# Patient Record
Sex: Female | Born: 1955 | Race: White | Hispanic: No | Marital: Married | Smoking: Never smoker
Health system: Southern US, Community
[De-identification: ages and names within clinical notes are randomized; demographics above are authoritative.]

## PROBLEM LIST (undated history)

## (undated) DIAGNOSIS — I10 Essential (primary) hypertension: Secondary | ICD-10-CM

## (undated) HISTORY — PX: OTHER SURGICAL HISTORY: SHX169

## (undated) HISTORY — DX: Essential (primary) hypertension: I10

---

## 1993-06-13 DIAGNOSIS — C4491 Basal cell carcinoma of skin, unspecified: Secondary | ICD-10-CM

## 1993-06-13 HISTORY — DX: Basal cell carcinoma of skin, unspecified: C44.91

## 2008-06-13 HISTORY — PX: COLONOSCOPY: SHX174

## 2014-06-13 HISTORY — PX: COLONOSCOPY: SHX174

## 2018-02-13 ENCOUNTER — Other Ambulatory Visit (HOSPITAL_COMMUNITY): Payer: Self-pay | Admitting: Internal Medicine

## 2018-02-13 DIAGNOSIS — Z13820 Encounter for screening for osteoporosis: Secondary | ICD-10-CM

## 2018-02-22 ENCOUNTER — Ambulatory Visit (HOSPITAL_COMMUNITY)
Admission: RE | Admit: 2018-02-22 | Discharge: 2018-02-22 | Disposition: A | Discharge status: Home / Self Care | Payer: Managed Care, Other (non HMO) | Source: Ambulatory Visit | Attending: Internal Medicine | Admitting: Internal Medicine

## 2018-02-22 DIAGNOSIS — Z13820 Encounter for screening for osteoporosis: Secondary | ICD-10-CM | POA: Diagnosis not present

## 2018-02-22 DIAGNOSIS — M8589 Other specified disorders of bone density and structure, multiple sites: Secondary | ICD-10-CM | POA: Diagnosis not present

## 2018-12-03 ENCOUNTER — Encounter (INDEPENDENT_AMBULATORY_CARE_PROVIDER_SITE_OTHER): Payer: Managed Care, Other (non HMO) | Admitting: Internal Medicine

## 2019-02-13 ENCOUNTER — Telehealth (INDEPENDENT_AMBULATORY_CARE_PROVIDER_SITE_OTHER): Payer: Self-pay

## 2019-02-13 NOTE — Telephone Encounter (Signed)
Pt called and stated [facility] called LVM she was due for her visit.  Pt stated you told her she did not need to have a PAP every year anymore.   Also want to see if she is due for a Mammogram also. If so she would like to do everything in [place] if she can.

## 2019-02-13 NOTE — Telephone Encounter (Signed)
Sent the order via fax to [facility] to schedule.

## 2019-02-13 NOTE — Telephone Encounter (Signed)
Pt would like to have [place] aph.

## 2019-02-25 ENCOUNTER — Other Ambulatory Visit (INDEPENDENT_AMBULATORY_CARE_PROVIDER_SITE_OTHER): Payer: Self-pay

## 2019-02-25 DIAGNOSIS — Z1231 Encounter for screening mammogram for malignant neoplasm of breast: Secondary | ICD-10-CM

## 2019-02-25 NOTE — Progress Notes (Signed)
Okay, please go ahead and order a screening mammogram for this patient.  Thanks.

## 2019-02-25 NOTE — Progress Notes (Signed)
Pt is coming due for screening mammogram. Would like it local at [facility].

## 2019-02-26 ENCOUNTER — Other Ambulatory Visit (INDEPENDENT_AMBULATORY_CARE_PROVIDER_SITE_OTHER): Payer: Self-pay

## 2019-02-26 DIAGNOSIS — Z1231 Encounter for screening mammogram for malignant neoplasm of breast: Secondary | ICD-10-CM

## 2019-02-26 NOTE — Progress Notes (Signed)
Pt would like Mammogram to be done here local;Crystal Mccarthy.

## 2019-03-13 ENCOUNTER — Ambulatory Visit (INDEPENDENT_AMBULATORY_CARE_PROVIDER_SITE_OTHER): Payer: Self-pay | Admitting: Internal Medicine

## 2019-03-25 ENCOUNTER — Encounter (INDEPENDENT_AMBULATORY_CARE_PROVIDER_SITE_OTHER): Payer: Self-pay | Admitting: Internal Medicine

## 2019-03-25 ENCOUNTER — Other Ambulatory Visit: Payer: Self-pay

## 2019-03-25 ENCOUNTER — Ambulatory Visit (INDEPENDENT_AMBULATORY_CARE_PROVIDER_SITE_OTHER): Payer: Medicare HMO | Admitting: Internal Medicine

## 2019-03-25 VITALS — BP 140/90 | HR 72 | Ht 63.0 in | Wt 164.6 lb

## 2019-03-25 DIAGNOSIS — R21 Rash and other nonspecific skin eruption: Secondary | ICD-10-CM | POA: Diagnosis not present

## 2019-03-25 MED ORDER — PREDNISONE 20 MG PO TABS: 40.0000 mg | ORAL_TABLET | Freq: Every day | ORAL | 1 refills | Status: DC

## 2019-03-25 NOTE — Progress Notes (Signed)
Wellness Office Visit  Subjective:  Patient ID: Crystal Mccarthy, female    DOB: 31-Jul-1955  Age: 63 y.o. MRN: [id number]  CC: This lady comes in for an acute visit with a chief complaint of skin rash. HPI  She noticed about 2 weeks ago that she had one lesion in the left trunk area just inferior to the left breast which was measuring about 0.5 cm in diameter and actually has improved in the last couple of weeks. However, she started to get more of a similar skin lesions on the left side of her neck in the last few days and also on her back. She cannot relate that she has changed medications, washing powder or soaps. She has played golf 4 times in the last couple of weeks.  She denies being in contact with any plants that might of caused some kind of contact dermatitis. The rash is itchy but not painful.  It does not seem to follow a dermatomal distribution. She systemically feels reasonably well except for fatigue. Past Medical History:  Diagnosis Date  . Essential hypertension, benign Yes      History reviewed. No pertinent family history.  Social History   Social History Narrative   Married for 29 years,lives with husband.     Current Meds  Medication Sig  . buPROPion (WELLBUTRIN XL) 300 MG 24 hr tablet Take 300 mg by mouth daily.  . Cholecalciferol (VITAMIN D-3) 125 MCG (5000 UT) TABS Take 1 tablet by mouth daily.  Marland Kitchen eletriptan (RELPAX) 40 MG tablet Take 40 mg by mouth as needed for migraine or headache. May repeat in 2 hours if headache persists or recurs.  Marland Kitchen estradiol (ESTRACE) 2 MG tablet Take 2 mg by mouth daily.  Marland Kitchen losartan-hydrochlorothiazide (HYZAAR) 100-25 MG tablet Take 1 tablet by mouth daily.  . Magnesium 500 MG TABS Take 1 tablet by mouth daily.  . Melatonin 3 MG CAPS Take 3 mg by mouth every evening.  . NP THYROID 120 MG tablet Take 120 mg by mouth daily before breakfast.  . pantoprazole (PROTONIX) 40 MG tablet Take 40 mg by mouth daily as needed.  .  progesterone (PROMETRIUM) 200 MG capsule Take 400 mg by mouth every evening.  . Testosterone 20 % CREA Apply 2.5 mg topically daily.  . valACYclovir (VALTREX) 1000 MG tablet Take 1,000 mg by mouth 2 (two) times daily as needed.       Objective:   Today's Vitals: BP 140/90   Pulse 72   Ht 5\' 3"  (1.6 m)   Wt 164 lb 9.6 oz (74.7 kg)   BMI 29.16 kg/m  Vitals with BMI 03/25/2019  Height 5\' 3"   Weight 164 lbs 10 oz  BMI [id number]  Systolic [id number]  Diastolic 90  Pulse 72     Physical Exam  She looks systemically well and she appears to have macular rash on the left side of her neck, lesion on her right back and also on the left trunk.  These lesions appear to be similar to a contact dermatitis type of rash.     Assessment   1. Skin rash       Tests ordered No orders of the defined types were placed in this encounter.    Plan: 1. I wonder if the rash does represent some kind of contact dermatitis and I will treat her empirically with prednisone.  I have explained possible side effects of steroids.  If she does not improve, she will let me know.  Meds ordered this encounter  Medications  . predniSONE (DELTASONE) 20 MG tablet    Sig: Take 2 tablets (40 mg total) by mouth daily with breakfast.    Dispense:  10 tablet    Refill:  1     Luther Parody, MD

## 2019-04-04 ENCOUNTER — Other Ambulatory Visit (INDEPENDENT_AMBULATORY_CARE_PROVIDER_SITE_OTHER): Payer: Self-pay | Admitting: Internal Medicine

## 2019-04-05 ENCOUNTER — Encounter (INDEPENDENT_AMBULATORY_CARE_PROVIDER_SITE_OTHER): Payer: Self-pay | Admitting: Internal Medicine

## 2019-04-05 ENCOUNTER — Other Ambulatory Visit (INDEPENDENT_AMBULATORY_CARE_PROVIDER_SITE_OTHER): Payer: Self-pay | Admitting: Internal Medicine

## 2019-04-05 MED ORDER — BUPROPION HCL ER (XL) 300 MG PO TB24: 300.0000 mg | ORAL_TABLET | Freq: Every day | ORAL | 0 refills | Status: DC

## 2019-04-24 ENCOUNTER — Other Ambulatory Visit (INDEPENDENT_AMBULATORY_CARE_PROVIDER_SITE_OTHER): Payer: Self-pay | Admitting: Internal Medicine

## 2019-04-24 ENCOUNTER — Other Ambulatory Visit (INDEPENDENT_AMBULATORY_CARE_PROVIDER_SITE_OTHER): Payer: Self-pay

## 2019-04-24 MED ORDER — BUPROPION HCL ER (XL) 300 MG PO TB24: 300.0000 mg | ORAL_TABLET | Freq: Every day | ORAL | 2 refills | Status: DC

## 2019-04-24 MED ORDER — BUPROPION HCL ER (XL) 300 MG PO TB24: 300.0000 mg | ORAL_TABLET | Freq: Every day | ORAL | 0 refills | Status: DC

## 2019-04-24 MED ORDER — ELETRIPTAN HYDROBROMIDE 40 MG PO TABS: 40.0000 mg | ORAL_TABLET | ORAL | 0 refills | Status: DC | PRN

## 2019-04-24 MED ORDER — ELETRIPTAN HYDROBROMIDE 40 MG PO TABS: 40.0000 mg | ORAL_TABLET | ORAL | 3 refills | Status: DC | PRN

## 2019-04-25 ENCOUNTER — Other Ambulatory Visit (INDEPENDENT_AMBULATORY_CARE_PROVIDER_SITE_OTHER): Payer: Self-pay | Admitting: Internal Medicine

## 2019-04-25 MED ORDER — NP THYROID 120 MG PO TABS: 120.0000 mg | ORAL_TABLET | Freq: Every day | ORAL | 0 refills | Status: DC

## 2019-04-25 MED ORDER — ESTRADIOL 2 MG PO TABS: 2.0000 mg | ORAL_TABLET | Freq: Every day | ORAL | 0 refills | Status: DC

## 2019-04-25 MED ORDER — BUPROPION HCL ER (XL) 300 MG PO TB24: 300.0000 mg | ORAL_TABLET | Freq: Every day | ORAL | 0 refills | Status: DC

## 2019-04-25 MED ORDER — PROGESTERONE MICRONIZED 200 MG PO CAPS: 400.0000 mg | ORAL_CAPSULE | Freq: Every evening | ORAL | 0 refills | Status: DC

## 2019-04-27 ENCOUNTER — Encounter (INDEPENDENT_AMBULATORY_CARE_PROVIDER_SITE_OTHER): Payer: Self-pay | Admitting: Internal Medicine

## 2019-05-01 ENCOUNTER — Ambulatory Visit (INDEPENDENT_AMBULATORY_CARE_PROVIDER_SITE_OTHER): Payer: Medicare HMO | Admitting: Internal Medicine

## 2019-05-01 ENCOUNTER — Encounter (INDEPENDENT_AMBULATORY_CARE_PROVIDER_SITE_OTHER): Payer: Self-pay | Admitting: Internal Medicine

## 2019-05-01 ENCOUNTER — Other Ambulatory Visit: Payer: Self-pay

## 2019-05-01 VITALS — BP 116/70 | HR 72 | Temp 98.0°F | Resp 18 | Ht 65.0 in | Wt 163.0 lb

## 2019-05-01 DIAGNOSIS — I10 Essential (primary) hypertension: Secondary | ICD-10-CM | POA: Insufficient documentation

## 2019-05-01 DIAGNOSIS — E782 Mixed hyperlipidemia: Secondary | ICD-10-CM

## 2019-05-01 DIAGNOSIS — Z23 Encounter for immunization: Secondary | ICD-10-CM | POA: Diagnosis not present

## 2019-05-01 DIAGNOSIS — E2839 Other primary ovarian failure: Secondary | ICD-10-CM | POA: Insufficient documentation

## 2019-05-01 DIAGNOSIS — E559 Vitamin D deficiency, unspecified: Secondary | ICD-10-CM | POA: Insufficient documentation

## 2019-05-01 DIAGNOSIS — F32A Depression, unspecified: Secondary | ICD-10-CM

## 2019-05-01 DIAGNOSIS — Z1159 Encounter for screening for other viral diseases: Secondary | ICD-10-CM | POA: Diagnosis not present

## 2019-05-01 DIAGNOSIS — R69 Illness, unspecified: Secondary | ICD-10-CM | POA: Diagnosis not present

## 2019-05-01 DIAGNOSIS — F329 Major depressive disorder, single episode, unspecified: Secondary | ICD-10-CM

## 2019-05-01 DIAGNOSIS — E039 Hypothyroidism, unspecified: Secondary | ICD-10-CM

## 2019-05-01 HISTORY — DX: Hypothyroidism, unspecified: E03.9

## 2019-05-01 HISTORY — DX: Vitamin D deficiency, unspecified: E55.9

## 2019-05-01 HISTORY — DX: Other primary ovarian failure: E28.39

## 2019-05-01 HISTORY — DX: Depression, unspecified: F32.A

## 2019-05-01 NOTE — Progress Notes (Signed)
Metrics: Intervention Frequency ACO  Documented Smoking Status Yearly  Screened one or more times in 24 months  Cessation Counseling or  Active cessation medication Past 24 months  Past 24 months   Guideline developer: UpToDate (See UpToDate for funding source) Date Released: 2014       Wellness Office Visit  Subjective:  Patient ID: Crystal Mccarthy, female    DOB: [birth year]  Age: 63 y.o. MRN: [id number]  CC: This lady comes in for follow-up of depression, hypertension, hypothyroidism, menopausal symptoms and vitamin D deficiency. HPI  She is doing reasonably well and maintaining all her medications consistently. She continues with Wellbutrin for depression which seems to be helping her as she told me that she had not taken it for a week by accident and felt very down and depressed. She continues on bioidentical hormone therapy for menopausal symptoms and she is tolerating these well. She continues on antihypertensive therapy.  She denies any chest pain, dyspnea, palpitations or limb weakness. She also continues on desiccated thyroid for hypothyroidism. Past Medical History:  Diagnosis Date  . Depression 05/01/2019  . Essential hypertension, benign Yes  . Hypothyroidism, adult 05/01/2019  . 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 Vitamin D deficiency disease 05/01/2019      History reviewed. No pertinent family history.  Social History   Social History Narrative   Married for 90 years,lives with husband.   Social History   Tobacco Use  . Smoking status: Never Smoker  . Smokeless tobacco: Never Used  Substance Use Topics  . Alcohol use: Not on file    Comment: occ    Current Meds  Medication Sig  . buPROPion (WELLBUTRIN XL) 300 MG 24 hr tablet Take 1 tablet (300 mg total) by mouth daily.  . Cholecalciferol (VITAMIN D-3) 125 MCG (5000 UT) TABS Take 1 tablet by mouth daily.  Marland Kitchen eletriptan (RELPAX) 40 MG tablet Take 1 tablet (40 mg total) by mouth as needed for migraine or  headache. May repeat in 2 hours if headache persists or recurs.  Marland Kitchen estradiol (ESTRACE) 2 MG tablet Take 1 tablet (2 mg total) by mouth daily.  Marland Kitchen losartan-hydrochlorothiazide (HYZAAR) 100-25 MG tablet Take 1 tablet by mouth daily.  . Magnesium 500 MG TABS Take 1 tablet by mouth daily.  . Melatonin 3 MG CAPS Take 3 mg by mouth every evening.  . NP THYROID 120 MG tablet Take 1 tablet (120 mg total) by mouth daily before breakfast.  . pantoprazole (PROTONIX) 40 MG tablet Take 40 mg by mouth daily as needed.  . progesterone (PROMETRIUM) 200 MG capsule Take 2 capsules (400 mg total) by mouth every evening.  . Testosterone 20 % CREA Apply 2.5 mg topically daily.  . valACYclovir (VALTREX) 1000 MG tablet Take 1,000 mg by mouth 2 (two) times daily as needed.     Nutrition  She tries to be as consistent as she can.  She has not lost further weight. Sleep  Adequate.  Exercise  She does walk with her dog on a regular basis. Bio Identical Hormones  Testosterone therapy is being used off label for symptoms of testosterone deficiency and benefits that it produces based on several studies.  These benefits include decreasing body fat, increasing in lean muscle mass and increasing in bone density.  There is improvement of memory, cognition.  There is improvement in exercise tolerance and endurance.  Testosterone therapy has also been shown to be protective against coronary artery disease, cerebrovascular disease, diabetes, hypertension and degenerative joint disease.  I have discussed with the patient the FDA warnings regarding testosterone therapy, benefits and side effects and modes of administration as well as monitoring blood levels and side effects  on a regular basis The patient is agreeable that testosterone therapy should be an integral part of his/her wellness,quality of life and prevention of chronic disease.  Micronized progesterone is being used in this patient for multiple benefits based on  studies including protection against uterine cancer, breast cancer, osteoporosis and heart disease. The patient has been counseled regarding side effects, benefits and modes of administration. The patient is agreeable that this therapy is an integral part of her wellness, quality of life and prevention of chronic disease.  Estradiol is being used in this patient for multiple benefits based on several studies including protection against heart disease, cerebrovascular disease, osteoporosis, colon cancer, Alzheimer's disease, macular degeneration and cataracts. The patient has been counseled regarding benefits and side effects and modes of administration. The patient is agreeable that this therapy is an integral to part of her wellness, quality of life and prevention of chronic disease.  Objective:   Today's Vitals: BP 116/70 (BP Location: Left Arm, Patient Position: Sitting, Cuff Size: Normal)   Pulse 72   Temp 98 F (36.7 C) (Temporal)   Resp 18   Ht '5\' 5"'$  (1.651 m)   Wt 163 lb (73.9 kg)   SpO2 96%   BMI 27.12 kg/m  Vitals with BMI 05/01/2019 03/25/2019  Height '5\' 5"'$  '5\' 3"'$   Weight 163 lbs 164 lbs 10 oz  BMI 35.46 56.81  Systolic 275 170  Diastolic 70 90  Pulse 72 72     Physical Exam   She looks systemically well.  Her weight is very stable but she has not lost further weight.  No new physical findings today.    Assessment   1. Mixed hyperlipidemia   2. Need for immunization against influenza   3. Hypothyroidism, adult   4. Vitamin D deficiency disease   5. Essential hypertension, benign   6. Primary ovarian failure   7. Depression, unspecified depression type   8. Encounter for hepatitis C screening test for low risk patient       Tests ordered Orders Placed This Encounter  Procedures  . Flu Vaccine QUAD High Dose(Fluad)  . CMP with eGFR([facility])  . Lipid Panel  . Vitamin D, 25-hydroxy  . T3, Free  . T4  . Estradiol  . Progesterone  . Testos,Total,Free and  SHBG (Female)  . Hep C Antibody     Plan: 1. She will continue with desiccated NP thyroid and we will see what the thyroid levels are. 2. She will continue with vitamin D3 supplementation and I will check levels again today. 3. She will continue with all bioidentical hormone therapies and I will check all the levels today also. 4. She will continue with Wellbutrin which seems to keep her very stable. 5. High-dose influenza vaccination was given today. 6. Further recommendations will depend on blood results and I will see her in about 4 months time for follow-up.   No orders of the defined types were placed in this encounter.   Doree Albee, MD

## 2019-05-05 LAB — COMPLETE METABOLIC PANEL WITH GFR
AG Ratio: 1.8 (calc) (ref 1.0–2.5)
ALT: 10 U/L (ref 6–29)
AST: 18 U/L (ref 10–35)
Albumin: 4.3 g/dL (ref 3.6–5.1)
Alkaline phosphatase (APISO): 62 U/L (ref 37–153)
BUN: 13 mg/dL (ref 7–25)
CO2: 28 mmol/L (ref 20–32)
Calcium: 9.4 mg/dL (ref 8.6–10.4)
Chloride: 102 mmol/L (ref 98–110)
Creat: 0.91 mg/dL (ref 0.50–0.99)
GFR, Est African American: 77 mL/min/{1.73_m2} (ref >=60)
GFR, Est Non African American: 66 mL/min/{1.73_m2} (ref >=60)
Globulin: 2.4 g/dL (calc) (ref 1.9–3.7)
Glucose, Bld: 86 mg/dL (ref 65–99)
Potassium: 3.7 mmol/L (ref 3.5–5.3)
Sodium: 139 mmol/L (ref 135–146)
Total Bilirubin: 0.4 mg/dL (ref 0.2–1.2)
Total Protein: 6.7 g/dL (ref 6.1–8.1)

## 2019-05-05 LAB — TESTOS,TOTAL,FREE AND SHBG (FEMALE)
Free Testosterone: 11.3 pg/mL — ABNORMAL HIGH (ref 0.1–6.4)
Sex Hormone Binding: 208 nmol/L — ABNORMAL HIGH (ref 14–73)
Testosterone, Total, LC-MS-MS: 225 ng/dL — ABNORMAL HIGH (ref 2–45)

## 2019-05-05 LAB — LIPID PANEL
Cholesterol: 253 mg/dL — ABNORMAL HIGH (ref <200)
HDL: 68 mg/dL (ref >=50)
LDL Cholesterol (Calc): 168 mg/dL (calc) — ABNORMAL HIGH
Non-HDL Cholesterol (Calc): 185 mg/dL (calc) — ABNORMAL HIGH (ref <130)
Total CHOL/HDL Ratio: 3.7 (calc) (ref <5.0)
Triglycerides: 70 mg/dL (ref <150)

## 2019-05-05 LAB — T3, FREE: T3, Free: 3.5 pg/mL (ref 2.3–4.2)

## 2019-05-05 LAB — PROGESTERONE: Progesterone: 32.6 ng/mL

## 2019-05-05 LAB — T4: T4, Total: 6.7 ug/dL (ref 5.1–11.9)

## 2019-05-05 LAB — ESTRADIOL: Estradiol: 78 pg/mL

## 2019-05-05 LAB — HEPATITIS C ANTIBODY
Hepatitis C Ab: NONREACTIVE
SIGNAL TO CUT-OFF: 0.02 (ref <1.00)

## 2019-05-05 LAB — VITAMIN D 25 HYDROXY (VIT D DEFICIENCY, FRACTURES): Vit D, 25-Hydroxy: 68 ng/mL (ref 30–100)

## 2019-06-14 HISTORY — PX: BREAST BIOPSY: SHX20

## 2019-06-20 DIAGNOSIS — G43909 Migraine, unspecified, not intractable, without status migrainosus: Secondary | ICD-10-CM | POA: Insufficient documentation

## 2019-06-20 DIAGNOSIS — Z01419 Encounter for gynecological examination (general) (routine) without abnormal findings: Secondary | ICD-10-CM | POA: Diagnosis not present

## 2019-06-20 DIAGNOSIS — Z1231 Encounter for screening mammogram for malignant neoplasm of breast: Secondary | ICD-10-CM | POA: Diagnosis not present

## 2019-06-20 DIAGNOSIS — Z6830 Body mass index (BMI) 30.0-30.9, adult: Secondary | ICD-10-CM | POA: Diagnosis not present

## 2019-06-25 ENCOUNTER — Other Ambulatory Visit: Payer: Self-pay

## 2019-06-25 ENCOUNTER — Other Ambulatory Visit: Payer: Self-pay | Admitting: Obstetrics and Gynecology

## 2019-06-25 ENCOUNTER — Ambulatory Visit: Payer: Medicare HMO

## 2019-06-25 ENCOUNTER — Ambulatory Visit
Admission: RE | Admit: 2019-06-25 | Discharge: 2019-06-25 | Disposition: A | Discharge status: Home / Self Care | Payer: Medicare HMO | Source: Ambulatory Visit | Attending: Obstetrics and Gynecology | Admitting: Obstetrics and Gynecology

## 2019-06-25 DIAGNOSIS — N6311 Unspecified lump in the right breast, upper outer quadrant: Secondary | ICD-10-CM | POA: Diagnosis not present

## 2019-06-25 DIAGNOSIS — R928 Other abnormal and inconclusive findings on diagnostic imaging of breast: Secondary | ICD-10-CM

## 2019-06-25 DIAGNOSIS — N6310 Unspecified lump in the right breast, unspecified quadrant: Secondary | ICD-10-CM

## 2019-06-26 ENCOUNTER — Encounter (INDEPENDENT_AMBULATORY_CARE_PROVIDER_SITE_OTHER): Payer: Self-pay | Admitting: Internal Medicine

## 2019-06-28 ENCOUNTER — Other Ambulatory Visit: Payer: Self-pay

## 2019-06-28 ENCOUNTER — Ambulatory Visit
Admission: RE | Admit: 2019-06-28 | Discharge: 2019-06-28 | Disposition: A | Discharge status: Home / Self Care | Payer: Medicare HMO | Source: Ambulatory Visit | Attending: Obstetrics and Gynecology | Admitting: Obstetrics and Gynecology

## 2019-06-28 DIAGNOSIS — N6310 Unspecified lump in the right breast, unspecified quadrant: Secondary | ICD-10-CM

## 2019-06-28 DIAGNOSIS — N6311 Unspecified lump in the right breast, upper outer quadrant: Secondary | ICD-10-CM | POA: Diagnosis not present

## 2019-07-08 ENCOUNTER — Encounter (INDEPENDENT_AMBULATORY_CARE_PROVIDER_SITE_OTHER): Payer: Self-pay | Admitting: Internal Medicine

## 2019-07-17 DIAGNOSIS — R69 Illness, unspecified: Secondary | ICD-10-CM | POA: Diagnosis not present

## 2019-07-18 ENCOUNTER — Other Ambulatory Visit (INDEPENDENT_AMBULATORY_CARE_PROVIDER_SITE_OTHER): Payer: Self-pay | Admitting: Internal Medicine

## 2019-07-22 ENCOUNTER — Other Ambulatory Visit (INDEPENDENT_AMBULATORY_CARE_PROVIDER_SITE_OTHER): Payer: Self-pay | Admitting: Internal Medicine

## 2019-07-29 ENCOUNTER — Telehealth (INDEPENDENT_AMBULATORY_CARE_PROVIDER_SITE_OTHER): Payer: Self-pay

## 2019-07-29 NOTE — Telephone Encounter (Signed)
She should take 2 capsules 200mg  every night(total dose 400mg  at night)

## 2019-07-29 NOTE — Telephone Encounter (Signed)
Pt concern on progesterone 200mg . Is this order correct? She was only taking 200 mg a day. Now she has a Rx for 200 mg but to ake 2 capsules a day.

## 2019-07-31 NOTE — Telephone Encounter (Signed)
I think she should be on Progesterone 400mg (2 capsules) at night. If this dose makes her too drowsy then she can go to 1 capsule at night and I will discuss this on the next visit.

## 2019-07-31 NOTE — Telephone Encounter (Signed)
She said she sleeps we are well. So she was not sure of the pill was different and all. She is okay with change.

## 2019-07-31 NOTE — Telephone Encounter (Signed)
Pt was called and given instructions on medication was correct.

## 2019-08-29 ENCOUNTER — Ambulatory Visit (INDEPENDENT_AMBULATORY_CARE_PROVIDER_SITE_OTHER): Payer: Medicare HMO | Admitting: Internal Medicine

## 2019-09-11 ENCOUNTER — Encounter (INDEPENDENT_AMBULATORY_CARE_PROVIDER_SITE_OTHER): Payer: Self-pay | Admitting: Internal Medicine

## 2019-09-11 ENCOUNTER — Ambulatory Visit (INDEPENDENT_AMBULATORY_CARE_PROVIDER_SITE_OTHER): Payer: Medicare HMO | Admitting: Internal Medicine

## 2019-09-11 ENCOUNTER — Other Ambulatory Visit: Payer: Self-pay

## 2019-09-11 VITALS — BP 125/85 | HR 78 | Temp 99.4°F | Ht 63.0 in | Wt 168.2 lb

## 2019-09-11 DIAGNOSIS — Z1211 Encounter for screening for malignant neoplasm of colon: Secondary | ICD-10-CM | POA: Diagnosis not present

## 2019-09-11 DIAGNOSIS — E559 Vitamin D deficiency, unspecified: Secondary | ICD-10-CM

## 2019-09-11 DIAGNOSIS — E039 Hypothyroidism, unspecified: Secondary | ICD-10-CM | POA: Diagnosis not present

## 2019-09-11 DIAGNOSIS — F32A Depression, unspecified: Secondary | ICD-10-CM

## 2019-09-11 DIAGNOSIS — R69 Illness, unspecified: Secondary | ICD-10-CM | POA: Diagnosis not present

## 2019-09-11 DIAGNOSIS — F329 Major depressive disorder, single episode, unspecified: Secondary | ICD-10-CM

## 2019-09-11 DIAGNOSIS — E2839 Other primary ovarian failure: Secondary | ICD-10-CM

## 2019-09-11 MED ORDER — THYROID 30 MG PO TABS: 30.0000 mg | ORAL_TABLET | Freq: Every day | ORAL | 3 refills | Status: DC

## 2019-09-11 NOTE — Progress Notes (Signed)
Metrics: Intervention Frequency ACO  Documented Smoking Status Yearly  Screened one or more times in 24 months  Cessation Counseling or  Active cessation medication Past 24 months  Past 24 months   Guideline developer: UpToDate (See UpToDate for funding source) Date Released: 2014       Wellness Office Visit  Subjective:  Patient ID: Crystal Mccarthy, female    DOB: [birth year]  Age: 64 y.o. MRN: [id number]  CC: This lady comes in for follow-up of hypertension, hypothyroidism, menopausal symptoms and bioidentical hormone therapy. HPI  She is doing reasonably well but she has gained weight and she is frustrated.  She realizes that she has not kept to a diet that seems to work for her.  She is determined to go back on weight watchers which seems to be successful for previously. She is consistent with bioidentical hormone therapy and her levels last time were very good. She does describe cystic lesions on her vulva and I have suggested that she decrease testosterone cream 4 days a week now.  I reviewed her blood work from the last visit and her T3 is not optimal. Past Medical History:  Diagnosis Date  . Depression 05/01/2019  . Essential hypertension, benign Yes  . Hypothyroidism, adult 05/01/2019  . 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 Vitamin D deficiency disease 05/01/2019      History reviewed. No pertinent family history.  Social History   Social History Narrative   Married for 59 years,lives with husband.   Social History   Tobacco Use  . Smoking status: Never Smoker  . Smokeless tobacco: Never Used  Substance Use Topics  . Alcohol use: Not on file    Comment: occ    Current Meds  Medication Sig  . Ascorbic Acid (VITAMIN C) 1000 MG tablet Take 1,000 mg by mouth daily.  Marland Kitchen aspirin EC 81 MG tablet Take 81 mg by mouth daily.  Marland Kitchen buPROPion (WELLBUTRIN XL) 300 MG 24 hr tablet Take 1 tablet (300 mg total) by mouth daily.  . Cholecalciferol (VITAMIN D-3) 125 MCG (5000 UT)  TABS Take 1 tablet by mouth daily.  . Coenzyme Q10 100 MG TABS Take 300 mg by mouth daily.  Marland Kitchen eletriptan (RELPAX) 40 MG tablet Take 1 tablet (40 mg total) by mouth as needed for migraine or headache. May repeat in 2 hours if headache persists or recurs.  Marland Kitchen estradiol (ESTRACE) 2 MG tablet Take 1 tablet (2 mg total) by mouth daily.  Javier Docker Oil 500 MG CAPS Take 500 mg by mouth daily.  Marland Kitchen LORAZEPAM PO Take 0.5 mg by mouth.  . losartan-hydrochlorothiazide (HYZAAR) 100-25 MG tablet Take 1 tablet by mouth once daily  . Magnesium 500 MG TABS Take 1 tablet by mouth daily.  . Melatonin 3 MG CAPS Take 3 mg by mouth every evening.  . NP THYROID 120 MG tablet TAKE 1 TABLET(120 MG) BY MOUTH DAILY BEFORE BREAKFAST  . pantoprazole (PROTONIX) 40 MG tablet Take 40 mg by mouth daily as needed.  . progesterone (PROMETRIUM) 200 MG capsule TAKE TWO CAPSULES BY MOUTH DAILY IN THE EVENING  . Testosterone 20 % CREA Apply 2.5 mg topically daily.  . valACYclovir (VALTREX) 1000 MG tablet Take 1,000 mg by mouth 2 (two) times daily as needed.      Objective:   Today's Vitals: BP 125/85 (BP Location: Left Arm, Patient Position: Sitting, Cuff Size: Normal)   Pulse 78   Temp 99.4 F (37.4 C) (Temporal)   Ht 5\' 3"  (1.6  m)   Wt 168 lb 3.2 oz (76.3 kg)   SpO2 98%   BMI 29.80 kg/m  Vitals with BMI 09/11/2019 05/01/2019 03/25/2019  Height 5\' 3"  5\' 5"  5\' 3"   Weight 168 lbs 3 oz 163 lbs 164 lbs 10 oz  BMI 29.8 [id number]  Systolic 0000000 [id number]  Diastolic 85 70 90  Pulse 78 72 72     Physical Exam  She looks systemically well but she has gained 5 pounds since the last visit in November.  Blood pressure is under good control.     Assessment   1. Colon cancer screening   2. Hypothyroidism, adult   3. Primary ovarian failure   4. Vitamin D deficiency disease   5. Depression, unspecified depression type       Tests ordered Orders Placed This Encounter  Procedures  . Ambulatory referral to  Gastroenterology     Plan: 1. I recommended we increase the NP thyroid and add 30 mg at lunchtime or in the morning with the 120 mg.  Hopefully she will tolerate it and have sent a new prescription.  We need to optimize T3 levels. 2. I will refer to gastroenterology as she has not had a colonoscopy for more than 5 years and she is due now. 3. We discussed nutrition again and she will start weight watchers.  I discussed intermittent fasting and also a plant-based diet as well. 4. I will see her in about 4 months for an annual physical exam.  Today I spent 30 minutes with the patient discussing her thyroid and nutrition above.   Meds ordered this encounter  Medications  . thyroid (NP THYROID) 30 MG tablet    Sig: Take 1 tablet (30 mg total) by mouth daily before breakfast.    Dispense:  30 tablet    Refill:  3    Kolina Kube Luther Parody, MD

## 2019-09-16 ENCOUNTER — Encounter: Payer: Self-pay | Admitting: Internal Medicine

## 2019-09-17 ENCOUNTER — Other Ambulatory Visit (INDEPENDENT_AMBULATORY_CARE_PROVIDER_SITE_OTHER): Payer: Self-pay | Admitting: Internal Medicine

## 2019-09-25 ENCOUNTER — Other Ambulatory Visit: Payer: Medicare HMO

## 2019-10-07 ENCOUNTER — Telehealth (INDEPENDENT_AMBULATORY_CARE_PROVIDER_SITE_OTHER): Payer: Self-pay | Admitting: Internal Medicine

## 2019-10-07 ENCOUNTER — Other Ambulatory Visit (INDEPENDENT_AMBULATORY_CARE_PROVIDER_SITE_OTHER): Payer: Self-pay | Admitting: Internal Medicine

## 2019-10-07 MED ORDER — NP THYROID 120 MG PO TABS: ORAL_TABLET | ORAL | 1 refills | Status: DC

## 2019-10-08 ENCOUNTER — Other Ambulatory Visit (INDEPENDENT_AMBULATORY_CARE_PROVIDER_SITE_OTHER): Payer: Self-pay | Admitting: Nurse Practitioner

## 2019-10-08 NOTE — Telephone Encounter (Signed)
Done

## 2019-10-17 ENCOUNTER — Encounter (INDEPENDENT_AMBULATORY_CARE_PROVIDER_SITE_OTHER): Payer: Self-pay | Admitting: Internal Medicine

## 2019-10-29 NOTE — Progress Notes (Signed)
Referring Provider: Doree Albee, MD Primary Care Physician:  Doree Albee, MD Primary Gastroenterologist:  Dr. Gala Romney  Chief Complaint  Patient presents with  . Colonoscopy    due for 5 yr tcs; had tcs age 64 and 33; last tcs had polyp    HPI:   Crystal Mccarthy is a 64 y.o. female presenting today at the request of Doree Albee, MD for colon cancer screening. OV due to medications.   Today:  Reports TCS at age 52 that was a normal exam and again 5 years ago in [place]. Can't remember exactly where colonoscopy was performed or who performed the procedure.  Had 1 polyp and was advised to repeat in 5 years. No GI concerns. No abdominal pain. BMs every other day. No constipation or diarrhea. Has started eating more fiber which has made a huge difference. No blood in the stool. No black stool. No unintentional weight loss. No N/V. Takes Protonix as needed for GERD. This is rare and typically after eating something spicy. No dysphagia.   No family history of colon cancer.   Feels much better since increasing thyroid medication.  Has been struggling with a lot of fatigue.  Reports having a poor prep in the past. Repots for her last colonoscopy, she had an extra day of clears and did fine.   Past Medical History:  Diagnosis Date  . Depression 05/01/2019  . Essential hypertension, benign Yes  . Hypothyroidism, adult 05/01/2019  . 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 Vitamin D deficiency disease 05/01/2019    Past Surgical History:  Procedure Laterality Date  . cataract surgery    . COLONOSCOPY  2016   [place]; per patient, 1 polyp with recommendations to repeat in 5 years  . COLONOSCOPY  2010   [place], per patient, normal exam    Current Outpatient Medications  Medication Sig Dispense Refill  . Ascorbic Acid (VITAMIN C) 1000 MG tablet Take 1,000 mg by mouth daily.    Marland Kitchen aspirin EC 81 MG tablet Take 81 mg by mouth daily.    Marland Kitchen buPROPion (WELLBUTRIN XL)  300 MG 24 hr tablet Take 1 tablet (300 mg total) by mouth daily. 90 tablet 0  . Cholecalciferol (VITAMIN D-3) 125 MCG (5000 UT) TABS Take 1 tablet by mouth daily.    . Coenzyme Q10 100 MG TABS Take 300 mg by mouth daily.    Marland Kitchen eletriptan (RELPAX) 40 MG tablet Take 1 tablet (40 mg total) by mouth as needed for migraine or headache. May repeat in 2 hours if headache persists or recurs. 18 tablet 3  . estradiol (ESTRACE) 2 MG tablet TAKE ONE TABLET BY MOUTH ONE TIME DAILY  90 tablet 0  . Krill Oil 500 MG CAPS Take 500 mg by mouth daily.    Marland Kitchen LORAZEPAM PO Take 0.5 mg by mouth as needed.     Marland Kitchen losartan-hydrochlorothiazide (HYZAAR) 100-25 MG tablet Take 1 tablet by mouth once daily 90 tablet 0  . Magnesium 500 MG TABS Take 1 tablet by mouth daily.    . Melatonin 3 MG CAPS Take 3 mg by mouth every evening.    . NP THYROID 120 MG tablet TAKE 1 TABLET(120 MG) BY MOUTH DAILY BEFORE BREAKFAST 90 tablet 1  . pantoprazole (PROTONIX) 40 MG tablet Take 40 mg by mouth daily as needed.    . progesterone (PROMETRIUM) 200 MG capsule TAKE TWO CAPSULES BY MOUTH DAILY IN THE EVENING 180 capsule 0  . Testosterone 20 %  CREA Apply 2.5 mg topically daily.    Marland Kitchen thyroid (NP THYROID) 30 MG tablet Take 1 tablet (30 mg total) by mouth daily before breakfast. 30 tablet 3  . valACYclovir (VALTREX) 1000 MG tablet Take 1,000 mg by mouth 2 (two) times daily as needed.     No current facility-administered medications for this visit.    Allergies as of 10/30/2019 - Review Complete 10/30/2019  Allergen Reaction Noted  . Montelukast sodium Hives 09/11/2019    Family History  Problem Relation Age of Onset  . Colon cancer Neg Hx     Social History   Socioeconomic History  . Marital status: Married    Spouse name: Not on file  . Number of children: Not on file  . Years of education: Not on file  . Highest education level: Not on file  Occupational History  . Not on file  Tobacco Use  . Smoking status: Never Smoker  .  Smokeless tobacco: Never Used  Substance and Sexual Activity  . Alcohol use: Yes    Comment: occ- special occasions  . Drug use: Never  . Sexual activity: Not on file  Other Topics Concern  . Not on file  Social History Narrative   Married for 55 years,lives with husband.   Social Determinants of Health   Financial Resource Strain:   . Difficulty of Paying Living Expenses:   Food Insecurity:   . Worried About Charity fundraiser in the Last Year:   . Arboriculturist in the Last Year:   Transportation Needs:   . Film/video editor (Medical):   Marland Kitchen Lack of Transportation (Non-Medical):   Physical Activity:   . Days of Exercise per Week:   . Minutes of Exercise per Session:   Stress:   . Feeling of Stress :   Social Connections:   . Frequency of Communication with Friends and Family:   . Frequency of Social Gatherings with Friends and Family:   . Attends Religious Services:   . Active Member of Clubs or Organizations:   . Attends Archivist Meetings:   Marland Kitchen Marital Status:   Intimate Partner Violence:   . Fear of Current or Ex-Partner:   . Emotionally Abused:   Marland Kitchen Physically Abused:   . Sexually Abused:     Review of Systems: Gen: Denies any fever, chills, lightheadedness, dizziness, presyncope, syncope. CV: Denies chest pain. Rare heart palpitations.  Resp: Denies shortness of breath or cough.  GI: See HPI GU : Denies urinary burning, urinary frequency, urinary hesitancy MS: Denies regular joint pain.  Derm: Denies rash Psych: Denies depression or anxiety. Medications working well.  Heme: Denies bruising or bleeding  Physical Exam: BP (!) 147/94   Pulse 70   Temp 98 F (36.7 C) (Temporal)   Ht 5\' 3"  (1.6 m)   Wt 157 lb 9.6 oz (71.5 kg)   BMI 27.92 kg/m  General:   Alert and oriented. Pleasant and cooperative. Well-nourished and well-developed.  Head:  Normocephalic and atraumatic. Eyes:  Without icterus, sclera clear and conjunctiva pink.  Ears:   Normal auditory acuity. Lungs:  Clear to auscultation bilaterally. No wheezes, rales, or rhonchi. No distress.  Heart:  S1, S2 present without murmurs appreciated.  Abdomen:  +BS, soft, non-tender and non-distended. No HSM noted. No guarding or rebound. No masses appreciated.  Rectal:  Deferred  Msk:  Symmetrical without gross deformities. Normal posture. Extremities:  Without edema. Neurologic:  Alert and  oriented x4;  grossly normal neurologically. Skin:  Intact without significant lesions or rashes. Psych:  Normal mood and affect.

## 2019-10-30 ENCOUNTER — Ambulatory Visit: Payer: Medicare HMO | Admitting: Gastroenterology

## 2019-10-30 ENCOUNTER — Encounter (INDEPENDENT_AMBULATORY_CARE_PROVIDER_SITE_OTHER): Payer: Self-pay | Admitting: Internal Medicine

## 2019-10-30 ENCOUNTER — Encounter: Payer: Self-pay | Admitting: Gastroenterology

## 2019-10-30 ENCOUNTER — Other Ambulatory Visit: Payer: Self-pay

## 2019-10-30 DIAGNOSIS — Z86010 Personal history of colonic polyps: Secondary | ICD-10-CM | POA: Diagnosis not present

## 2019-10-30 NOTE — Assessment & Plan Note (Addendum)
64 year old female with history of HTN, depression, hypothyroidism, and colon polyps presenting to schedule her surveillance colonoscopy.  Reports prior colonoscopies in [place]. First at age 10 with normal exam.  Last colonoscopy 5 years ago with 1 polyp and recommended to repeat in 5 years.  No lower GI symptoms at this time.  No alarm symptoms.  No family history of colon cancer.  Patient cannot remember exactly where her colonoscopy was performed or who performed the procedure, so I cannot request records.  At this point, we we will go off her report of colon polyps with recommendations to repeat in 5 years and go ahead and proceed with colonoscopy.  Of note, she does report having a poor prep for her first colonoscopy but did well with 2 days of clears for her second colonoscopy.  Proceed with TCS with propofol with Dr. Gala Romney in the near future. The risks, benefits, and alternatives have been discussed in detail with patient. They have stated understanding and desire to proceed.  Propofol due to Wellbutrin and lorazepam Modified bowel prep: 2 days of clear liquids, 1 complete bowel prep the day before procedure with option of additional one half prep the morning before procedure if stools are not clear. Follow-up as recommended at time of colonoscopy.

## 2019-10-30 NOTE — Patient Instructions (Signed)
We will get you scheduled for colonoscopy in the near future with Dr. Gala Romney.  As you have had trouble with the prep in the past, we will modify her bowel prep. You will have 2 days of clear liquids and we will have you complete 1 full bowel prep with the possibility of an extra 1/2  bowel prep the morning prior to your procedure if your stools are not clear (see separate instructions).  We will plan to follow-up with you as Dr. Gala Romney recommends.  Aliene Altes, PA-C [facility]

## 2019-10-31 ENCOUNTER — Ambulatory Visit: Payer: Medicare HMO | Admitting: Gastroenterology

## 2019-11-04 ENCOUNTER — Other Ambulatory Visit (INDEPENDENT_AMBULATORY_CARE_PROVIDER_SITE_OTHER): Payer: Self-pay | Admitting: Internal Medicine

## 2019-11-12 ENCOUNTER — Telehealth (INDEPENDENT_AMBULATORY_CARE_PROVIDER_SITE_OTHER): Payer: Self-pay

## 2019-11-12 ENCOUNTER — Other Ambulatory Visit (INDEPENDENT_AMBULATORY_CARE_PROVIDER_SITE_OTHER): Payer: Self-pay | Admitting: Internal Medicine

## 2019-11-12 MED ORDER — PROGESTERONE 200 MG PO CAPS: 400.0000 mg | ORAL_CAPSULE | Freq: Every day | ORAL | 1 refills | Status: DC

## 2019-11-12 NOTE — Telephone Encounter (Signed)
Crystal Mccarthy is calling requesting a refill on her Progesterone 200mg  to be called in to the pharmacy [facility]

## 2019-11-15 DIAGNOSIS — M9902 Segmental and somatic dysfunction of thoracic region: Secondary | ICD-10-CM | POA: Diagnosis not present

## 2019-11-15 DIAGNOSIS — M9901 Segmental and somatic dysfunction of cervical region: Secondary | ICD-10-CM | POA: Diagnosis not present

## 2019-11-15 DIAGNOSIS — M545 Low back pain: Secondary | ICD-10-CM | POA: Diagnosis not present

## 2019-11-15 DIAGNOSIS — M546 Pain in thoracic spine: Secondary | ICD-10-CM | POA: Diagnosis not present

## 2019-11-15 DIAGNOSIS — M9903 Segmental and somatic dysfunction of lumbar region: Secondary | ICD-10-CM | POA: Diagnosis not present

## 2019-11-15 DIAGNOSIS — M542 Cervicalgia: Secondary | ICD-10-CM | POA: Diagnosis not present

## 2019-11-20 DIAGNOSIS — M9903 Segmental and somatic dysfunction of lumbar region: Secondary | ICD-10-CM | POA: Diagnosis not present

## 2019-11-20 DIAGNOSIS — M542 Cervicalgia: Secondary | ICD-10-CM | POA: Diagnosis not present

## 2019-11-20 DIAGNOSIS — M545 Low back pain: Secondary | ICD-10-CM | POA: Diagnosis not present

## 2019-11-20 DIAGNOSIS — M9902 Segmental and somatic dysfunction of thoracic region: Secondary | ICD-10-CM | POA: Diagnosis not present

## 2019-11-20 DIAGNOSIS — M546 Pain in thoracic spine: Secondary | ICD-10-CM | POA: Diagnosis not present

## 2019-11-20 DIAGNOSIS — M9901 Segmental and somatic dysfunction of cervical region: Secondary | ICD-10-CM | POA: Diagnosis not present

## 2019-11-26 ENCOUNTER — Other Ambulatory Visit: Payer: Self-pay | Admitting: Obstetrics and Gynecology

## 2019-11-26 ENCOUNTER — Other Ambulatory Visit: Payer: Self-pay | Admitting: Internal Medicine

## 2019-11-26 DIAGNOSIS — N63 Unspecified lump in unspecified breast: Secondary | ICD-10-CM

## 2019-11-27 ENCOUNTER — Encounter (INDEPENDENT_AMBULATORY_CARE_PROVIDER_SITE_OTHER): Payer: Self-pay | Admitting: Internal Medicine

## 2019-11-29 DIAGNOSIS — M546 Pain in thoracic spine: Secondary | ICD-10-CM | POA: Diagnosis not present

## 2019-11-29 DIAGNOSIS — M9903 Segmental and somatic dysfunction of lumbar region: Secondary | ICD-10-CM | POA: Diagnosis not present

## 2019-11-29 DIAGNOSIS — M545 Low back pain: Secondary | ICD-10-CM | POA: Diagnosis not present

## 2019-11-29 DIAGNOSIS — M9901 Segmental and somatic dysfunction of cervical region: Secondary | ICD-10-CM | POA: Diagnosis not present

## 2019-11-29 DIAGNOSIS — M9902 Segmental and somatic dysfunction of thoracic region: Secondary | ICD-10-CM | POA: Diagnosis not present

## 2019-11-29 DIAGNOSIS — M542 Cervicalgia: Secondary | ICD-10-CM | POA: Diagnosis not present

## 2019-12-12 ENCOUNTER — Other Ambulatory Visit (INDEPENDENT_AMBULATORY_CARE_PROVIDER_SITE_OTHER): Payer: Self-pay | Admitting: Internal Medicine

## 2019-12-16 ENCOUNTER — Other Ambulatory Visit (INDEPENDENT_AMBULATORY_CARE_PROVIDER_SITE_OTHER): Payer: Self-pay | Admitting: Internal Medicine

## 2019-12-17 ENCOUNTER — Other Ambulatory Visit (INDEPENDENT_AMBULATORY_CARE_PROVIDER_SITE_OTHER): Payer: Self-pay

## 2019-12-17 ENCOUNTER — Other Ambulatory Visit (INDEPENDENT_AMBULATORY_CARE_PROVIDER_SITE_OTHER): Payer: Self-pay | Admitting: Internal Medicine

## 2019-12-17 MED ORDER — ESTRADIOL 2 MG PO TABS: 2.0000 mg | ORAL_TABLET | Freq: Every day | ORAL | 0 refills | Status: DC

## 2019-12-17 NOTE — Telephone Encounter (Signed)
refill 

## 2019-12-23 ENCOUNTER — Telehealth: Payer: Self-pay | Admitting: *Deleted

## 2019-12-23 MED ORDER — PEG 3350-KCL-NA BICARB-NACL 420 G PO SOLR: ORAL | 0 refills | Status: DC

## 2019-12-23 NOTE — Telephone Encounter (Signed)
LMOVM for pt to schedule TCS with RMR with propofol

## 2019-12-23 NOTE — Telephone Encounter (Signed)
Called pt. She is scheduled for procedure 8/30 at 1:30pm. Patient aware will need pre-op/covid prior. Advised will mail to her. She needs extra prep and advised I will send this to her pharmacy [facility]. Confirmed mailing address.

## 2019-12-23 NOTE — Addendum Note (Signed)
Addended by: Cheron Every on: 12/23/2019 02:02 PM   Modules accepted: Orders

## 2019-12-24 ENCOUNTER — Encounter: Payer: Self-pay | Admitting: *Deleted

## 2019-12-25 ENCOUNTER — Ambulatory Visit
Admission: RE | Admit: 2019-12-25 | Discharge: 2019-12-25 | Disposition: A | Discharge status: Home / Self Care | Payer: Medicare HMO | Source: Ambulatory Visit | Attending: Internal Medicine | Admitting: Internal Medicine

## 2019-12-25 ENCOUNTER — Ambulatory Visit: Payer: Medicare HMO

## 2019-12-25 ENCOUNTER — Other Ambulatory Visit: Payer: Self-pay

## 2019-12-25 DIAGNOSIS — R928 Other abnormal and inconclusive findings on diagnostic imaging of breast: Secondary | ICD-10-CM | POA: Diagnosis not present

## 2019-12-25 DIAGNOSIS — N63 Unspecified lump in unspecified breast: Secondary | ICD-10-CM

## 2020-01-06 ENCOUNTER — Other Ambulatory Visit (INDEPENDENT_AMBULATORY_CARE_PROVIDER_SITE_OTHER): Payer: Self-pay

## 2020-01-06 ENCOUNTER — Ambulatory Visit (INDEPENDENT_AMBULATORY_CARE_PROVIDER_SITE_OTHER): Payer: Medicare HMO | Admitting: Internal Medicine

## 2020-01-06 ENCOUNTER — Other Ambulatory Visit: Payer: Self-pay

## 2020-01-06 ENCOUNTER — Other Ambulatory Visit (INDEPENDENT_AMBULATORY_CARE_PROVIDER_SITE_OTHER): Payer: Self-pay | Admitting: Internal Medicine

## 2020-01-06 ENCOUNTER — Encounter (INDEPENDENT_AMBULATORY_CARE_PROVIDER_SITE_OTHER): Payer: Self-pay | Admitting: Internal Medicine

## 2020-01-06 VITALS — BP 120/80 | HR 72 | Temp 98.0°F | Ht 63.0 in | Wt 158.4 lb

## 2020-01-06 DIAGNOSIS — Z0001 Encounter for general adult medical examination with abnormal findings: Secondary | ICD-10-CM | POA: Diagnosis not present

## 2020-01-06 DIAGNOSIS — I10 Essential (primary) hypertension: Secondary | ICD-10-CM

## 2020-01-06 DIAGNOSIS — E782 Mixed hyperlipidemia: Secondary | ICD-10-CM | POA: Diagnosis not present

## 2020-01-06 DIAGNOSIS — E039 Hypothyroidism, unspecified: Secondary | ICD-10-CM

## 2020-01-06 DIAGNOSIS — L299 Pruritus, unspecified: Secondary | ICD-10-CM

## 2020-01-06 DIAGNOSIS — M25552 Pain in left hip: Secondary | ICD-10-CM

## 2020-01-06 DIAGNOSIS — E2839 Other primary ovarian failure: Secondary | ICD-10-CM | POA: Diagnosis not present

## 2020-01-06 LAB — LIPID PANEL
Cholesterol: 220 mg/dL — ABNORMAL HIGH (ref <200)
HDL: 59 mg/dL (ref >=50)
LDL Cholesterol (Calc): 144 mg/dL (calc) — ABNORMAL HIGH
Non-HDL Cholesterol (Calc): 161 mg/dL (calc) — ABNORMAL HIGH (ref <130)
Total CHOL/HDL Ratio: 3.7 (calc) (ref <5.0)
Triglycerides: 72 mg/dL (ref <150)

## 2020-01-06 LAB — TSH: TSH: 0.02 mIU/L — ABNORMAL LOW (ref 0.40–4.50)

## 2020-01-06 LAB — T3, FREE: T3, Free: 9.2 pg/mL — ABNORMAL HIGH (ref 2.3–4.2)

## 2020-01-06 MED ORDER — THYROID 30 MG PO TABS: 150.0000 mg | ORAL_TABLET | Freq: Every day | ORAL | 1 refills | Status: DC

## 2020-01-06 MED ORDER — THYROID 30 MG PO TABS: 150.0000 mg | ORAL_TABLET | Freq: Every day | ORAL | 3 refills | Status: DC

## 2020-01-06 NOTE — Progress Notes (Deleted)
   Chief Complaint: *** HPI: ***  Past Medical History:  Diagnosis Date  . Depression 05/01/2019  . Essential hypertension, benign Yes  . Hypothyroidism, adult 05/01/2019  . 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 Vitamin D deficiency disease 05/01/2019   Past Surgical History:  Procedure Laterality Date  . BREAST BIOPSY Right 06/2019   negative  . cataract surgery    . COLONOSCOPY  2016   [place]; per patient, 1 polyp with recommendations to repeat in 5 years  . COLONOSCOPY  2010   [place], per patient, normal exam     Social History   Social History Narrative   Married for 68 years,lives with husband.Retired.    Social History   Tobacco Use  . Smoking status: Never Smoker  . Smokeless tobacco: Never Used  Substance Use Topics  . Alcohol use: Yes    Comment: occ- special occasions      Allergies:  Allergies  Allergen Reactions  . Montelukast Sodium Hives     No outpatient medications have been marked as taking for the 01/06/20 encounter (Orders Only) with Doree Albee, MD.     Nutrition ***  Sleep ***  Exercise ***  Bio-identical hormones ***  Depression screen [facility] 2/9 09/11/2019  Decreased Interest 0  Down, Depressed, Hopeless 0  PHQ - 2 Score 0     ERD:EYCXK from the symptoms mentioned above,there are no other symptoms referable to all systems reviewed.       Physical Exam: There were no vitals taken for this visit. Vitals with BMI 01/06/2020 10/30/2019 09/11/2019  Height 5\' 3"  5\' 3"  5\' 3"   Weight 158 lbs 6 oz 157 lbs 10 oz 168 lbs 3 oz  BMI 28.07 48.18 56.3  Systolic 149 702 637  Diastolic 80 94 85  Pulse 72 70 78      *** General: Alert, cooperative, and appears to be the stated age.No pallor.  No jaundice.  No clubbing. Head: Normocephalic Eyes: Sclera white, pupils equal and reactive to light, red reflex x 2,  Ears: Normal bilaterally Oral cavity: Lips, mucosa, and tongue normal: Teeth and gums  normal Neck: No adenopathy, supple, symmetrical, trachea midline, and thyroid does not appear enlarged Respiratory: Clear to auscultation bilaterally.No wheezing, crackles or bronchial breathing. Cardiovascular: Heart sounds are present and appear to be normal without murmurs or added sounds.  No carotid bruits.  Peripheral pulses are present and equal bilaterally.: Gastrointestinal:positive bowel sounds, no hepatosplenomegaly.  No masses felt.No tenderness. Skin: Clear, No rashes noted.No worrisome skin lesions seen. Neurological: Grossly intact without focal findings, cranial nerves II through XII intact, muscle strength equal bilaterally Musculoskeletal: No acute joint abnormalities noted.Full range of movement noted with joints. Psychiatric: Affect appropriate, non-anxious.    Assessment  No diagnosis found.  Tests Ordered:  No orders of the defined types were placed in this encounter.    Plan  1.      Meds ordered this encounter  Medications  . thyroid (NP THYROID) 30 MG tablet    Sig: Take 5 tablets (150 mg total) by mouth daily before breakfast.    Dispense:  150 tablet    Refill:  3     Crystal Mccarthy C Crystal Mccarthy   01/06/2020, 11:56 AM

## 2020-01-06 NOTE — Telephone Encounter (Signed)
Please advise 

## 2020-01-06 NOTE — Telephone Encounter (Signed)
Crystal Mccarthy is asking for a 90 day supply of the 30 mg

## 2020-01-06 NOTE — Progress Notes (Signed)
Chief Complaint: This delightful 64 year old lady comes in for an annual physical exam and to address her chronic conditions which are described below. HPI: She has a history of hypertension, hypothyroidism, depression and anxiety and is postmenopausal on bioidentical hormone therapy. She also has hyperlipidemia and her cholesterol has been an issue that she has been trying to work on to reduce it without needing statin therapy. She does tell me that since I increased her NP thyroid by 30 mg, it seems to have given her much more energy than previous.  She feels much improved.  She is sleeping better also. She also is complaining of pruritus in her mid back which she has had for several months and would like further evaluation. She is also complaining of left hip pain especially when she goes upstairs.  She also used to have left ankle pain but this seems to be not a problem.  The left hip pain has been present for about 3 weeks. She continues on antihypertensive therapy for hypertension.  Thankfully, there is no evidence of coronary artery disease or cerebrovascular disease. She did have a breast biopsy not too long ago this year and thankfully this was negative.  Past Medical History:  Diagnosis Date  . Depression 05/01/2019  . Essential hypertension, benign Yes  . Hypothyroidism, adult 05/01/2019  . 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 Vitamin D deficiency disease 05/01/2019   Past Surgical History:  Procedure Laterality Date  . BREAST BIOPSY Right 06/2019   negative  . cataract surgery    . COLONOSCOPY  2016   [place]; per patient, 1 polyp with recommendations to repeat in 5 years  . COLONOSCOPY  2010   [place], per patient, normal exam     Social History   Social History Narrative   Married for 13 years,lives with husband.Retired.    Social History   Tobacco Use  . Smoking status: Never Smoker  . Smokeless tobacco: Never Used  Substance Use Topics  .  Alcohol use: Yes    Comment: occ- special occasions      Allergies:  Allergies  Allergen Reactions  . Montelukast Sodium Hives     Current Meds  Medication Sig  . Ascorbic Acid (VITAMIN C) 1000 MG tablet Take 1,000 mg by mouth daily.  Marland Kitchen aspirin EC 81 MG tablet Take 81 mg by mouth daily.  Marland Kitchen buPROPion (WELLBUTRIN XL) 300 MG 24 hr tablet Take 1 tablet (300 mg total) by mouth daily.  . Cholecalciferol (VITAMIN D-3) 125 MCG (5000 UT) TABS Take 1 tablet by mouth daily.  . Coenzyme Q10 100 MG TABS Take 300 mg by mouth daily.  Marland Kitchen eletriptan (RELPAX) 40 MG tablet Take 1 tablet (40 mg total) by mouth as needed for migraine or headache. May repeat in 2 hours if headache persists or recurs.  Marland Kitchen estradiol (ESTRACE) 2 MG tablet Take 1 tablet (2 mg total) by mouth daily.  Javier Docker Oil 500 MG CAPS Take 500 mg by mouth daily.  Marland Kitchen LORAZEPAM PO Take 0.5 mg by mouth as needed.   Marland Kitchen losartan-hydrochlorothiazide (HYZAAR) 100-25 MG tablet Take 1 tablet by mouth once daily  . Magnesium 500 MG TABS Take 1 tablet by mouth daily.  . Melatonin 3 MG CAPS Take 3 mg by mouth every evening.  . pantoprazole (PROTONIX) 40 MG tablet Take 40 mg by mouth daily as needed.  . progesterone (PROMETRIUM) 200 MG capsule Take 2 capsules (400 mg total) by mouth daily.  . Testosterone 20 %  CREA Apply 2.5 mg topically daily.  Marland Kitchen thyroid (NP THYROID) 30 MG tablet Take 1 tablet (30 mg total) by mouth daily before breakfast.  . valACYclovir (VALTREX) 1000 MG tablet Take 1,000 mg by mouth 2 (two) times daily as needed.  . [DISCONTINUED] NP THYROID 120 MG tablet TAKE 1 TABLET(120 MG) BY MOUTH DAILY BEFORE BREAKFAST      Depression screen [facility] 2/9 09/11/2019  Decreased Interest 0  Down, Depressed, Hopeless 0  PHQ - 2 Score 0     QVZ:DGLOV from the symptoms mentioned above,there are no other symptoms referable to all systems reviewed.       Physical Exam: Blood pressure 120/80, pulse 72, temperature 98 F (36.7 C), height 5'  3" (1.6 m), weight 158 lb 6.4 oz (71.8 kg), SpO2 97 %. Vitals with BMI 01/06/2020 10/30/2019 09/11/2019  Height 5\' 3"  5\' 3"  5\' 3"   Weight 158 lbs 6 oz 157 lbs 10 oz 168 lbs 3 oz  BMI 28.07 56.43 32.9  Systolic 518 841 660  Diastolic 80 94 85  Pulse 72 70 78      She looks systemically well, remains overweight.  Blood pressure is excellent. General: Alert, cooperative, and appears to be the stated age.No pallor.  No jaundice.  No clubbing. Head: Normocephalic Eyes: Sclera white, pupils equal and reactive to light, red reflex x 2,  Ears: Normal bilaterally Oral cavity: Lips, mucosa, and tongue normal: Teeth and gums normal Neck: No adenopathy, supple, symmetrical, trachea midline, and thyroid does not appear enlarged Respiratory: Clear to auscultation bilaterally.No wheezing, crackles or bronchial breathing. Cardiovascular: Heart sounds are present and appear to be normal without murmurs or added sounds.  No carotid bruits.  Peripheral pulses are present and equal bilaterally.: Gastrointestinal:positive bowel sounds, no hepatosplenomegaly.  No masses felt.No tenderness. Skin: Clear, No rashes noted.No worrisome skin lesions seen.  Examination of her back skin did not show any worrisome lesions. Neurological: Grossly intact without focal findings, cranial nerves II through XII intact, muscle strength equal bilaterally Musculoskeletal: No acute joint abnormalities noted.Full range of movement noted with joints. Psychiatric: Affect appropriate, non-anxious.    Assessment  1. Encounter for general adult medical examination with abnormal findings   2. Left hip pain   3. Pruritus   4. Hypothyroidism, adult   5. Primary ovarian failure   6. Essential hypertension, benign   7. Mixed hyperlipidemia     Tests Ordered:   Orders Placed This Encounter  Procedures  . T3, free  . TSH  . Lipid panel  . Ambulatory referral to Orthopedic Surgery  . Ambulatory referral to Dermatology      Plan  1. Reasonably healthy 64 year old lady who is overweight, has hypothyroidism and hyperlipidemia and is on bioidentical hormone therapy. 2. I will send her to orthopedics, Dr. Aline Brochure, for left hip pain as I think she does need further evaluation. 3. I will send that to Dr. Denna Haggard, dermatology for her pruritus also. 4. I will check lipid and thyroid function today to see if we need to make any adjustments. 5. She will continue with Hyzaar for hypertension which seems to be keeping it under good control. 6. She will continue with desiccated NP thyroid and we will check levels to see if her hypothyroidism needs to be further optimized. 7. Further recommendations will depend on blood results and I will see her for follow-up in the next 3 to 4 months. 8. Today in addition to a preventative visit, I independently performed an office visit  to address her chronic conditions and symptoms above.     No orders of the defined types were placed in this encounter.    Keiva Dina C Ovide Dusek   01/06/2020, 12:05 PM

## 2020-01-08 ENCOUNTER — Telehealth: Payer: Self-pay | Admitting: Physician Assistant

## 2020-01-08 NOTE — Telephone Encounter (Signed)
Referral from [place]. Appt.: 06/24/20 @2 :00 w/JCB

## 2020-01-15 DIAGNOSIS — M9903 Segmental and somatic dysfunction of lumbar region: Secondary | ICD-10-CM | POA: Diagnosis not present

## 2020-01-15 DIAGNOSIS — M546 Pain in thoracic spine: Secondary | ICD-10-CM | POA: Diagnosis not present

## 2020-01-15 DIAGNOSIS — R69 Illness, unspecified: Secondary | ICD-10-CM | POA: Diagnosis not present

## 2020-01-15 DIAGNOSIS — M545 Low back pain: Secondary | ICD-10-CM | POA: Diagnosis not present

## 2020-01-15 DIAGNOSIS — M9902 Segmental and somatic dysfunction of thoracic region: Secondary | ICD-10-CM | POA: Diagnosis not present

## 2020-01-15 DIAGNOSIS — M9901 Segmental and somatic dysfunction of cervical region: Secondary | ICD-10-CM | POA: Diagnosis not present

## 2020-01-15 DIAGNOSIS — M542 Cervicalgia: Secondary | ICD-10-CM | POA: Diagnosis not present

## 2020-01-20 MED ORDER — THYROID 30 MG PO TABS: 30.0000 mg | ORAL_TABLET | Freq: Every day | ORAL | 1 refills | Status: DC

## 2020-01-22 DIAGNOSIS — M9902 Segmental and somatic dysfunction of thoracic region: Secondary | ICD-10-CM | POA: Diagnosis not present

## 2020-01-22 DIAGNOSIS — M9903 Segmental and somatic dysfunction of lumbar region: Secondary | ICD-10-CM | POA: Diagnosis not present

## 2020-01-22 DIAGNOSIS — M542 Cervicalgia: Secondary | ICD-10-CM | POA: Diagnosis not present

## 2020-01-22 DIAGNOSIS — M25572 Pain in left ankle and joints of left foot: Secondary | ICD-10-CM | POA: Diagnosis not present

## 2020-01-22 DIAGNOSIS — H5210 Myopia, unspecified eye: Secondary | ICD-10-CM | POA: Diagnosis not present

## 2020-01-22 DIAGNOSIS — M9901 Segmental and somatic dysfunction of cervical region: Secondary | ICD-10-CM | POA: Diagnosis not present

## 2020-01-22 DIAGNOSIS — M9906 Segmental and somatic dysfunction of lower extremity: Secondary | ICD-10-CM | POA: Diagnosis not present

## 2020-01-22 DIAGNOSIS — M5442 Lumbago with sciatica, left side: Secondary | ICD-10-CM | POA: Diagnosis not present

## 2020-01-22 DIAGNOSIS — M546 Pain in thoracic spine: Secondary | ICD-10-CM | POA: Diagnosis not present

## 2020-01-28 NOTE — Progress Notes (Signed)
NEW PROBLEM//OFFICE VISIT  Chief Complaint  Patient presents with  . Hip Pain    left hip pain, denies any injury or fall.  . Ankle Pain    left ankle pain that has radiatied to the left hip,     64 year old female who says I have always had trouble with my lower back.  Presents with left hip pain associated with knee and ankle pain which actually started in the ankle then localized over the left greater trochanter.  Patient is concerned that when she climbs the steps and put pressure and puts pressure on her left foot she has instability weakness in the left hip and leg  She does have some lower back pain which is intermittent but again has been chronic  She has been having the hip issue for the last month or 2 and when she saw her primary care and let them know about it he recommended a referral   Review of Systems  Constitutional: Negative for chills and fever.  Gastrointestinal: Negative.   Genitourinary: Negative.   Musculoskeletal: Positive for back pain.     Past Medical History:  Diagnosis Date  . Depression 05/01/2019  . Essential hypertension, benign Yes  . Hypothyroidism, adult 05/01/2019  . 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 Vitamin D deficiency disease 05/01/2019    Past Surgical History:  Procedure Laterality Date  . BREAST BIOPSY Right 06/2019   negative  . cataract surgery    . COLONOSCOPY  2016   [place]; per patient, 1 polyp with recommendations to repeat in 5 years  . COLONOSCOPY  2010   [place], per patient, normal exam    Family History  Problem Relation Age of Onset  . Dementia Mother   . Heart disease Father   . Parkinson's disease Father   . Obesity Sister   . Colon cancer Neg Hx    Social History   Tobacco Use  . Smoking status: Never Smoker  . Smokeless tobacco: Never Used  Substance Use Topics  . Alcohol use: Yes    Comment: occ- special occasions  . Drug use: Never    Allergies  Allergen Reactions  .  Montelukast Sodium Hives    Current Meds  Medication Sig  . Ascorbic Acid (VITAMIN C) 1000 MG tablet Take 1,000 mg by mouth daily.  Marland Kitchen aspirin EC 81 MG tablet Take 81 mg by mouth daily.  Marland Kitchen buPROPion (WELLBUTRIN XL) 300 MG 24 hr tablet Take 1 tablet (300 mg total) by mouth daily.  . Cholecalciferol (VITAMIN D-3) 125 MCG (5000 UT) TABS Take 1 tablet by mouth daily.  . Coenzyme Q10 100 MG TABS Take 300 mg by mouth daily.  Marland Kitchen eletriptan (RELPAX) 40 MG tablet Take 1 tablet (40 mg total) by mouth as needed for migraine or headache. May repeat in 2 hours if headache persists or recurs.  Marland Kitchen estradiol (ESTRACE) 2 MG tablet Take 1 tablet (2 mg total) by mouth daily.  Javier Docker Oil 500 MG CAPS Take 500 mg by mouth daily.  Marland Kitchen LORAZEPAM PO Take 0.5 mg by mouth as needed.   Marland Kitchen losartan-hydrochlorothiazide (HYZAAR) 100-25 MG tablet Take 1 tablet by mouth once daily  . Magnesium 500 MG TABS Take 1 tablet by mouth daily.  . Melatonin 3 MG CAPS Take 3 mg by mouth every evening.  . pantoprazole (PROTONIX) 40 MG tablet Take 40 mg by mouth daily as needed.  . progesterone (PROMETRIUM) 200 MG capsule Take 2 capsules (400 mg total) by mouth  daily.  . Testosterone 20 % CREA Apply 2.5 mg topically daily.  Marland Kitchen thyroid (NP THYROID) 30 MG tablet Take 1 tablet (30 mg total) by mouth daily before breakfast.  . valACYclovir (VALTREX) 1000 MG tablet Take 1,000 mg by mouth 2 (two) times daily as needed.    BP 128/87   Pulse 68   Ht 5\' 3"  (1.6 m)   Wt 156 lb (70.8 kg)   BMI 27.63 kg/m   Physical Exam Constitutional:      General: She is not in acute distress.    Appearance: She is well-developed.  Cardiovascular:     Comments: No peripheral edema Skin:    General: Skin is warm and dry.  Neurological:     Mental Status: She is alert and oriented to person, place, and time.     Sensory: No sensory deficit.     Coordination: Coordination normal.     Gait: Gait normal.     Deep Tendon Reflexes: Reflexes are normal and  symmetric.     Ortho Exam  Tenderness across the lower back right side midline and left side tenderness left buttock  Tenderness left greater troches  Normal range of motion left and right hip  Normal sensation to pinprick bilaterally.  Good strength in both lower extremities L2, L3, L4, L5, S1  Good pulse perfusion  Straight leg raise positive left negative right  MEDICAL DECISION MAKING  A.  Encounter Diagnoses  Name Primary?  . Chronic left-sided low back pain with left-sided sciatica Yes  . Spinal stenosis of lumbar region without neurogenic claudication   . Degenerative scoliosis   . Trochanteric bursitis, left hip     B. DATA ANALYSED:   IMAGING: Interpretation of images: Office we took a pelvis hip look normal on both sides  We took a lumbar spine film she has severe spinal deformity scoliosis and spondylosis the deformities in the L5-S1 junction  Orders: MRI lumbar  Outside records reviewed: No records to review   C. MANAGEMENT   Inject trochanter for bursitis  Procedure note injection for left hip bursitis  Verbal consent was obtained for injection of the  left hip   Timeout was completed to confirm the injection site  The medications used were 40 mg of Depo-Medrol and 1% lidocaine 3 cc  Anesthesia was provided by ethyl chloride and the skin was prepped with alcohol.  After cleaning the skin with alcohol a 25-gauge needle was used to inject the left hip greater trochanteric bursa  No orders of the defined types were placed in this encounter.  Chronic problem greater than a year injection   Arther Abbott, MD  01/29/2020 11:18 AM

## 2020-01-29 ENCOUNTER — Encounter: Payer: Self-pay | Admitting: Orthopedic Surgery

## 2020-01-29 ENCOUNTER — Ambulatory Visit: Payer: Medicare HMO

## 2020-01-29 ENCOUNTER — Other Ambulatory Visit: Payer: Self-pay

## 2020-01-29 ENCOUNTER — Ambulatory Visit (INDEPENDENT_AMBULATORY_CARE_PROVIDER_SITE_OTHER): Payer: Medicare HMO | Admitting: Orthopedic Surgery

## 2020-01-29 VITALS — BP 128/87 | HR 68 | Ht 63.0 in | Wt 156.0 lb

## 2020-01-29 DIAGNOSIS — M545 Low back pain, unspecified: Secondary | ICD-10-CM

## 2020-01-29 DIAGNOSIS — M25552 Pain in left hip: Secondary | ICD-10-CM

## 2020-01-29 DIAGNOSIS — G8929 Other chronic pain: Secondary | ICD-10-CM

## 2020-01-29 DIAGNOSIS — M4180 Other forms of scoliosis, site unspecified: Secondary | ICD-10-CM

## 2020-01-29 DIAGNOSIS — M5442 Lumbago with sciatica, left side: Secondary | ICD-10-CM

## 2020-01-29 DIAGNOSIS — M48061 Spinal stenosis, lumbar region without neurogenic claudication: Secondary | ICD-10-CM | POA: Diagnosis not present

## 2020-01-29 DIAGNOSIS — M4150 Other secondary scoliosis, site unspecified: Secondary | ICD-10-CM

## 2020-01-29 DIAGNOSIS — M7062 Trochanteric bursitis, left hip: Secondary | ICD-10-CM

## 2020-01-31 ENCOUNTER — Telehealth: Payer: Self-pay | Admitting: Orthopedic Surgery

## 2020-01-31 NOTE — Telephone Encounter (Signed)
Patient called, states received a message from Amy yesterday regarding her MRI; has additional questions. Ph# [phone]

## 2020-01-31 NOTE — Telephone Encounter (Signed)
Patient states she has gotten call to schedule she wanted to make sure insurance approval went through, I told her it did.

## 2020-02-04 ENCOUNTER — Other Ambulatory Visit (INDEPENDENT_AMBULATORY_CARE_PROVIDER_SITE_OTHER): Payer: Self-pay | Admitting: Internal Medicine

## 2020-02-05 NOTE — Patient Instructions (Signed)
Stacye Noori  02/05/2020     @PREFPERIOPPHARMACY @   Your procedure is scheduled on  02/10/2020   Report to [facility] at  1200  P.M.  Call this number if you have problems the morning of surgery:  [phone]   Remember:  Follow the diet and prep instructions given to you by the office.                        Take these medicines the morning of surgery with A SIP OF WATER  Wellbutrin, relpax, ativan(if needed), protonix, thyroid.    Do not wear jewelry, make-up or nail polish.  Do not wear lotions, powders, or perfume. Please wear deodorant and brush your teeth.  Do not shave 48 hours prior to surgery.  Men may shave face and neck.  Do not bring valuables to the hospital.  [facility] is not responsible for any belongings or valuables.  Contacts, dentures or bridgework may not be worn into surgery.  Leave your suitcase in the car.  After surgery it may be brought to your room.  For patients admitted to the hospital, discharge time will be determined by your treatment team.  Patients discharged the day of surgery will not be allowed to drive home.   Name and phone number of your driver:   family Special instructions:  DO NOT smoke the morning of your procedure.  Please read over the following fact sheets that you were given. Anesthesia Post-op Instructions and Care and Recovery After Surgery       Colonoscopy, Adult, Care After This sheet gives you information about how to care for yourself after your procedure. Your health care provider may also give you more specific instructions. If you have problems or questions, contact your health care provider. What can I expect after the procedure? After the procedure, it is common to have:  A small amount of blood in your stool for 24 hours after the procedure.  Some gas.  Mild cramping or bloating of your abdomen. Follow these instructions at home: Eating and drinking   Drink enough fluid to keep your urine  pale yellow.  Follow instructions from your health care provider about eating or drinking restrictions.  Resume your normal diet as instructed by your health care provider. Avoid heavy or fried foods that are hard to digest. Activity  Rest as told by your health care provider.  Avoid sitting for a long time without moving. Get up to take short walks every 1-2 hours. This is important to improve blood flow and breathing. Ask for help if you feel weak or unsteady.  Return to your normal activities as told by your health care provider. Ask your health care provider what activities are safe for you. Managing cramping and bloating   Try walking around when you have cramps or feel bloated.  Apply heat to your abdomen as told by your health care provider. Use the heat source that your health care provider recommends, such as a moist heat pack or a heating pad. ? Place a towel between your skin and the heat source. ? Leave the heat on for 20-30 minutes. ? Remove the heat if your skin turns bright red. This is especially important if you are unable to feel pain, heat, or cold. You may have a greater risk of getting burned. General instructions  For the first 24 hours after the procedure: ? Do not drive or use machinery. ?  Do not sign important documents. ? Do not drink alcohol. ? Do your regular daily activities at a slower pace than normal. ? Eat soft foods that are easy to digest.  Take over-the-counter and prescription medicines only as told by your health care provider.  Keep all follow-up visits as told by your health care provider. This is important. Contact a health care provider if:  You have blood in your stool 2-3 days after the procedure. Get help right away if you have:  More than a small spotting of blood in your stool.  Large blood clots in your stool.  Swelling of your abdomen.  Nausea or vomiting.  A fever.  Increasing pain in your abdomen that is not relieved  with medicine. Summary  After the procedure, it is common to have a small amount of blood in your stool. You may also have mild cramping and bloating of your abdomen.  For the first 24 hours after the procedure, do not drive or use machinery, sign important documents, or drink alcohol.  Get help right away if you have a lot of blood in your stool, nausea or vomiting, a fever, or increased pain in your abdomen. This information is not intended to replace advice given to you by your health care provider. Make sure you discuss any questions you have with your health care provider. Document Revised: 12/24/2018 Document Reviewed: 12/24/2018 Elsevier Patient Education  [place] After These instructions provide you with information about caring for yourself after your procedure. Your health care provider may also give you more specific instructions. Your treatment has been planned according to current medical practices, but problems sometimes occur. Call your health care provider if you have any problems or questions after your procedure. What can I expect after the procedure? After your procedure, you may:  Feel sleepy for several hours.  Feel clumsy and have poor balance for several hours.  Feel forgetful about what happened after the procedure.  Have poor judgment for several hours.  Feel nauseous or vomit.  Have a sore throat if you had a breathing tube during the procedure. Follow these instructions at home: For at least 24 hours after the procedure:      Have a responsible adult stay with you. It is important to have someone help care for you until you are awake and alert.  Rest as needed.  Do not: ? Participate in activities in which you could fall or become injured. ? Drive. ? Use heavy machinery. ? Drink alcohol. ? Take sleeping pills or medicines that cause drowsiness. ? Make important decisions or sign legal documents. ? Take  care of children on your own. Eating and drinking  Follow the diet that is recommended by your health care provider.  If you vomit, drink water, juice, or soup when you can drink without vomiting.  Make sure you have little or no nausea before eating solid foods. General instructions  Take over-the-counter and prescription medicines only as told by your health care provider.  If you have sleep apnea, surgery and certain medicines can increase your risk for breathing problems. Follow instructions from your health care provider about wearing your sleep device: ? Anytime you are sleeping, including during daytime naps. ? While taking prescription pain medicines, sleeping medicines, or medicines that make you drowsy.  If you smoke, do not smoke without supervision.  Keep all follow-up visits as told by your health care provider. This is important. Contact a health  care provider if:  You keep feeling nauseous or you keep vomiting.  You feel light-headed.  You develop a rash.  You have a fever. Get help right away if:  You have trouble breathing. Summary  For several hours after your procedure, you may feel sleepy and have poor judgment.  Have a responsible adult stay with you for at least 24 hours or until you are awake and alert. This information is not intended to replace advice given to you by your health care provider. Make sure you discuss any questions you have with your health care provider. Document Revised: 08/28/2017 Document Reviewed: 09/20/2015 Elsevier Patient Education  [place].

## 2020-02-06 ENCOUNTER — Other Ambulatory Visit (HOSPITAL_COMMUNITY): Payer: Medicare HMO

## 2020-02-07 ENCOUNTER — Encounter (HOSPITAL_COMMUNITY)
Admission: RE | Admit: 2020-02-07 | Discharge: 2020-02-07 | Disposition: A | Discharge status: Home / Self Care | Payer: Medicare HMO | Source: Ambulatory Visit | Attending: Internal Medicine | Admitting: Internal Medicine

## 2020-02-07 ENCOUNTER — Other Ambulatory Visit (HOSPITAL_COMMUNITY)
Admission: RE | Admit: 2020-02-07 | Discharge: 2020-02-07 | Disposition: A | Discharge status: Home / Self Care | Payer: Medicare HMO | Source: Ambulatory Visit | Attending: Internal Medicine | Admitting: Internal Medicine

## 2020-02-07 ENCOUNTER — Encounter (HOSPITAL_COMMUNITY): Payer: Self-pay

## 2020-02-07 ENCOUNTER — Other Ambulatory Visit: Payer: Self-pay

## 2020-02-07 DIAGNOSIS — Z20822 Contact with and (suspected) exposure to covid-19: Secondary | ICD-10-CM | POA: Diagnosis not present

## 2020-02-07 DIAGNOSIS — Z01818 Encounter for other preprocedural examination: Secondary | ICD-10-CM | POA: Diagnosis not present

## 2020-02-07 LAB — BASIC METABOLIC PANEL
Anion gap: 7 (ref 5–15)
BUN: 16 mg/dL (ref 8–23)
CO2: 25 mmol/L (ref 22–32)
Calcium: 8.7 mg/dL — ABNORMAL LOW (ref 8.9–10.3)
Chloride: 104 mmol/L (ref 98–111)
Creatinine, Ser: 0.88 mg/dL (ref 0.44–1.00)
GFR calc Af Amer: >60 mL/min (ref >60)
GFR calc non Af Amer: >60 mL/min (ref >60)
Glucose, Bld: 89 mg/dL (ref 70–99)
Potassium: 3.4 mmol/L — ABNORMAL LOW (ref 3.5–5.1)
Sodium: 136 mmol/L (ref 135–145)

## 2020-02-07 LAB — SARS CORONAVIRUS 2 (TAT 6-24 HRS): SARS Coronavirus 2: NEGATIVE

## 2020-02-09 ENCOUNTER — Other Ambulatory Visit (INDEPENDENT_AMBULATORY_CARE_PROVIDER_SITE_OTHER): Payer: Self-pay | Admitting: Internal Medicine

## 2020-02-10 ENCOUNTER — Encounter (HOSPITAL_COMMUNITY)
Admission: RE | Disposition: A | Discharge status: Home / Self Care | Payer: Self-pay | Source: Home / Self Care | Attending: Internal Medicine

## 2020-02-10 ENCOUNTER — Ambulatory Visit (HOSPITAL_COMMUNITY): Payer: Medicare HMO | Admitting: Anesthesiology

## 2020-02-10 ENCOUNTER — Encounter (HOSPITAL_COMMUNITY): Payer: Self-pay | Admitting: Internal Medicine

## 2020-02-10 ENCOUNTER — Other Ambulatory Visit: Payer: Self-pay

## 2020-02-10 ENCOUNTER — Ambulatory Visit (HOSPITAL_COMMUNITY)
Admission: RE | Admit: 2020-02-10 | Discharge: 2020-02-10 | Disposition: A | Discharge status: Home / Self Care | Payer: Medicare HMO | Attending: Internal Medicine | Admitting: Internal Medicine

## 2020-02-10 DIAGNOSIS — Z888 Allergy status to other drugs, medicaments and biological substances status: Secondary | ICD-10-CM | POA: Insufficient documentation

## 2020-02-10 DIAGNOSIS — Z7982 Long term (current) use of aspirin: Secondary | ICD-10-CM | POA: Insufficient documentation

## 2020-02-10 DIAGNOSIS — Z1211 Encounter for screening for malignant neoplasm of colon: Secondary | ICD-10-CM | POA: Diagnosis not present

## 2020-02-10 DIAGNOSIS — D123 Benign neoplasm of transverse colon: Secondary | ICD-10-CM | POA: Insufficient documentation

## 2020-02-10 DIAGNOSIS — K635 Polyp of colon: Secondary | ICD-10-CM | POA: Diagnosis not present

## 2020-02-10 DIAGNOSIS — Z09 Encounter for follow-up examination after completed treatment for conditions other than malignant neoplasm: Secondary | ICD-10-CM | POA: Diagnosis not present

## 2020-02-10 DIAGNOSIS — Z8349 Family history of other endocrine, nutritional and metabolic diseases: Secondary | ICD-10-CM | POA: Diagnosis not present

## 2020-02-10 DIAGNOSIS — R69 Illness, unspecified: Secondary | ICD-10-CM | POA: Diagnosis not present

## 2020-02-10 DIAGNOSIS — I10 Essential (primary) hypertension: Secondary | ICD-10-CM | POA: Diagnosis not present

## 2020-02-10 DIAGNOSIS — Z86010 Personal history of colonic polyps: Secondary | ICD-10-CM | POA: Insufficient documentation

## 2020-02-10 DIAGNOSIS — Q438 Other specified congenital malformations of intestine: Secondary | ICD-10-CM | POA: Diagnosis not present

## 2020-02-10 DIAGNOSIS — Z79899 Other long term (current) drug therapy: Secondary | ICD-10-CM | POA: Insufficient documentation

## 2020-02-10 DIAGNOSIS — Z8249 Family history of ischemic heart disease and other diseases of the circulatory system: Secondary | ICD-10-CM | POA: Insufficient documentation

## 2020-02-10 DIAGNOSIS — R635 Abnormal weight gain: Secondary | ICD-10-CM | POA: Diagnosis not present

## 2020-02-10 DIAGNOSIS — Z820 Family history of epilepsy and other diseases of the nervous system: Secondary | ICD-10-CM | POA: Insufficient documentation

## 2020-02-10 DIAGNOSIS — E559 Vitamin D deficiency, unspecified: Secondary | ICD-10-CM | POA: Insufficient documentation

## 2020-02-10 DIAGNOSIS — E039 Hypothyroidism, unspecified: Secondary | ICD-10-CM | POA: Diagnosis not present

## 2020-02-10 DIAGNOSIS — F329 Major depressive disorder, single episode, unspecified: Secondary | ICD-10-CM | POA: Diagnosis not present

## 2020-02-10 HISTORY — PX: COLONOSCOPY WITH PROPOFOL: SHX5780

## 2020-02-10 HISTORY — PX: POLYPECTOMY: SHX5525

## 2020-02-10 SURGERY — COLONOSCOPY WITH PROPOFOL
Anesthesia: General

## 2020-02-10 MED ORDER — LACTATED RINGERS IV SOLN: INTRAVENOUS | Status: DC | PRN

## 2020-02-10 MED ORDER — CHLORHEXIDINE GLUCONATE CLOTH 2 % EX PADS: 6.0000 | MEDICATED_PAD | Freq: Once | CUTANEOUS | Status: DC

## 2020-02-10 MED ORDER — PHENYLEPHRINE 40 MCG/ML (10ML) SYRINGE FOR IV PUSH (FOR BLOOD PRESSURE SUPPORT)
PREFILLED_SYRINGE | INTRAVENOUS | Status: AC
  Filled 2020-02-10: qty 10

## 2020-02-10 MED ORDER — PROPOFOL 10 MG/ML IV BOLUS
INTRAVENOUS | Status: DC | PRN
  Administered 2020-02-10: 40 mg via INTRAVENOUS
  Administered 2020-02-10: 20 mg via INTRAVENOUS
  Administered 2020-02-10: 10 mg via INTRAVENOUS
  Administered 2020-02-10: 20 mg via INTRAVENOUS
  Administered 2020-02-10: 50 mg via INTRAVENOUS
  Administered 2020-02-10: 30 mg via INTRAVENOUS
  Administered 2020-02-10: 20 mg via INTRAVENOUS

## 2020-02-10 MED ORDER — STERILE WATER FOR IRRIGATION IR SOLN
Status: DC | PRN
  Administered 2020-02-10: 1.5 mL

## 2020-02-10 MED ORDER — PROPOFOL 10 MG/ML IV BOLUS
INTRAVENOUS | Status: AC
  Filled 2020-02-10: qty 20

## 2020-02-10 MED ORDER — PROPOFOL 500 MG/50ML IV EMUL
INTRAVENOUS | Status: DC | PRN
  Administered 2020-02-10: 150 ug/kg/min via INTRAVENOUS

## 2020-02-10 MED ORDER — LACTATED RINGERS IV SOLN
Freq: Once | INTRAVENOUS | Status: AC
  Administered 2020-02-10: 1000 mL via INTRAVENOUS

## 2020-02-10 MED ORDER — GLYCOPYRROLATE PF 0.2 MG/ML IJ SOSY
PREFILLED_SYRINGE | INTRAMUSCULAR | Status: AC
  Filled 2020-02-10: qty 1

## 2020-02-10 NOTE — Anesthesia Preprocedure Evaluation (Addendum)
Anesthesia Evaluation  Patient identified by MRN, date of birth, ID band Patient awake    Reviewed: Allergy & Precautions, NPO status , Patient's Chart, lab work & pertinent test results  History of Anesthesia Complications (+) history of anesthetic complications  Airway Mallampati: II  TM Distance: >3 FB Neck ROM: Full    Dental  (+) Dental Advisory Given, Teeth Intact   Pulmonary neg pulmonary ROS,    Pulmonary exam normal breath sounds clear to auscultation       Cardiovascular Exercise Tolerance: Good hypertension, Pt. on medications Normal cardiovascular exam Rhythm:Regular Rate:Normal     Neuro/Psych PSYCHIATRIC DISORDERS Anxiety Depression    GI/Hepatic negative GI ROS, Neg liver ROS,   Endo/Other  Hypothyroidism   Renal/GU negative Renal ROS  negative genitourinary   Musculoskeletal negative musculoskeletal ROS (+)   Abdominal   Peds  Hematology negative hematology ROS (+)   Anesthesia Other Findings   Reproductive/Obstetrics negative OB ROS                            Anesthesia Physical Anesthesia Plan  ASA: II  Anesthesia Plan: General   Post-op Pain Management:    Induction: Intravenous  PONV Risk Score and Plan: TIVA  Airway Management Planned: Nasal Cannula and Natural Airway  Additional Equipment:   Intra-op Plan:   Post-operative Plan:   Informed Consent: I have reviewed the patients History and Physical, chart, labs and discussed the procedure including the risks, benefits and alternatives for the proposed anesthesia with the patient or authorized representative who has indicated his/her understanding and acceptance.     Dental advisory given  Plan Discussed with: CRNA and Surgeon  Anesthesia Plan Comments:         Anesthesia Quick Evaluation

## 2020-02-10 NOTE — Discharge Instructions (Signed)
  Colonoscopy Discharge Instructions  Read the instructions outlined below and refer to this sheet in the next few weeks. These discharge instructions provide you with general information on caring for yourself after you leave the hospital. Your doctor may also give you specific instructions. While your treatment has been planned according to the most current medical practices available, unavoidable complications occasionally occur. If you have any problems or questions after discharge, call Dr. Gala Romney at [phone]. ACTIVITY  You may resume your regular activity, but move at a slower pace for the next 24 hours.   Take frequent rest periods for the next 24 hours.   Walking will help get rid of the air and reduce the bloated feeling in your belly (abdomen).   No driving for 24 hours (because of the medicine (anesthesia) used during the test).    Do not sign any important legal documents or operate any machinery for 24 hours (because of the anesthesia used during the test).  NUTRITION  Drink plenty of fluids.   You may resume your normal diet as instructed by your doctor.   Begin with a light meal and progress to your normal diet. Heavy or fried foods are harder to digest and may make you feel sick to your stomach (nauseated).   Avoid alcoholic beverages for 24 hours or as instructed.  MEDICATIONS  You may resume your normal medications unless your doctor tells you otherwise.  WHAT YOU CAN EXPECT TODAY  Some feelings of bloating in the abdomen.   Passage of more gas than usual.   Spotting of blood in your stool or on the toilet paper.  IF YOU HAD POLYPS REMOVED DURING THE COLONOSCOPY:  No aspirin products for 7 days or as instructed.   No alcohol for 7 days or as instructed.   Eat a soft diet for the next 24 hours.  FINDING OUT THE RESULTS OF YOUR TEST Not all test results are available during your visit. If your test results are not back during the visit, make an appointment  with your caregiver to find out the results. Do not assume everything is normal if you have not heard from your caregiver or the medical facility. It is important for you to follow up on all of your test results.  SEEK IMMEDIATE MEDICAL ATTENTION IF:  You have more than a spotting of blood in your stool.   Your belly is swollen (abdominal distention).   You are nauseated or vomiting.   You have a temperature over 101.   You have abdominal pain or discomfort that is severe or gets worse throughout the day.    1 polyp removed in your colon today  Further recommendations to follow pending review of pathology report  At patient request, I called Crystal Mccarthy at [phone] -reviewed results and recommendations

## 2020-02-10 NOTE — Transfer of Care (Signed)
Immediate Anesthesia Transfer of Care Note  Patient: Crystal Mccarthy  Procedure(s) Performed: COLONOSCOPY WITH PROPOFOL (N/A ) POLYPECTOMY  Patient Location: PACU  Anesthesia Type:General  Level of Consciousness: awake  Airway & Oxygen Therapy: Patient Spontanous Breathing  Post-op Assessment: Report given to RN  Post vital signs: Reviewed  Last Vitals:  Vitals Value Taken Time  BP 102/74 02/10/20 1357  Temp 36.5 C 02/10/20 1357  Pulse 109 02/10/20 1357  Resp 20 02/10/20 1357  SpO2 100 % 02/10/20 1357  Vitals shown include unvalidated device data.  Last Pain:  Vitals:   02/10/20 1320  TempSrc:   PainSc: 0-No pain      Patients Stated Pain Goal: 8 (39/12/25 8346)  Complications: No complications documented.

## 2020-02-10 NOTE — Anesthesia Postprocedure Evaluation (Signed)
Anesthesia Post Note  Patient: Crystal Mccarthy  Procedure(s) Performed: COLONOSCOPY WITH PROPOFOL (N/A ) POLYPECTOMY  Patient location during evaluation: PACU Anesthesia Type: General Level of consciousness: awake and alert Pain management: pain level controlled Vital Signs Assessment: post-procedure vital signs reviewed and stable Respiratory status: spontaneous breathing Cardiovascular status: blood pressure returned to baseline and stable Postop Assessment: no apparent nausea or vomiting Anesthetic complications: no   No complications documented.   Last Vitals:  Vitals:   02/10/20 1229 02/10/20 1357  BP: 109/78 102/74  Pulse:  65  Resp: 18 (!) 22  Temp: 36.6 C 36.5 C  SpO2: 97% 100%    Last Pain:  Vitals:   02/10/20 1320  TempSrc:   PainSc: 0-No pain                 Nannette Zill

## 2020-02-10 NOTE — Op Note (Signed)
[facility] Patient Name: Crystal Mccarthy Procedure Date: 02/10/2020 1:06 PM MRN: [id number] Date of Birth: 02/03/1956 Attending MD: Norvel Richards , MD CSN: [id number] Age: 64 Admit Type: Outpatient Procedure:                Colonoscopy Indications:              High risk colon cancer surveillance: Personal                            history of colonic polyps Providers:                Norvel Richards, MD, Janeece Riggers, RN, Lambert Mody, Randa Spike, Technician Referring MD:              Medicines:                Propofol per Anesthesia Complications:            No immediate complications. Estimated Blood Loss:     Estimated blood loss was minimal. Procedure:                Pre-Anesthesia Assessment:                           - Prior to the procedure, a History and Physical                            was performed, and patient medications and                            allergies were reviewed. The patient's tolerance of                            previous anesthesia was also reviewed. The risks                            and benefits of the procedure and the sedation                            options and risks were discussed with the patient.                            All questions were answered, and informed consent                            was obtained. Prior Anticoagulants: The patient has                            taken no previous anticoagulant or antiplatelet                            agents. ASA Grade Assessment: II - A patient with  mild systemic disease. After reviewing the risks                            and benefits, the patient was deemed in                            satisfactory condition to undergo the procedure.                           After obtaining informed consent, the colonoscope                            was passed under direct vision. Throughout the                             procedure, the patient's blood pressure, pulse, and                            oxygen saturations were monitored continuously. The                            CF-[id number] ([id number]) scope was introduced through                            the anus and advanced to the the cecum, identified                            by appendiceal orifice and ileocecal valve. The                            colonoscopy was performed without difficulty. The                            patient tolerated the procedure well. The quality                            of the bowel preparation was adequate. Scope In: 1:25:12 PM Scope Out: 1:50:28 PM Scope Withdrawal Time: 0 hours 13 minutes 6 seconds  Total Procedure Duration: 0 hours 25 minutes 16 seconds  Findings:      The perianal and digital rectal examinations were normal. Redundant,       elongated colon. External abdominal pressure required to reach the cecum.      A 6 mm polyp was found in the hepatic flexure. The polyp was sessile.       The polyp was removed with a cold snare. Resection and retrieval were       complete. Estimated blood loss was minimal.      The exam was otherwise without abnormality on direct and retroflexion       views. Impression:               - One 6 mm polyp at the hepatic flexure, removed                            with a cold snare. Resected and retrieved.  Redundant colon.                           - The examination was otherwise normal on direct                            and retroflexion views. Moderate Sedation:      Moderate (conscious) sedation was personally administered by an       anesthesia professional. The following parameters were monitored: oxygen       saturation, heart rate, blood pressure, respiratory rate, EKG, adequacy       of pulmonary ventilation, and response to care. Recommendation:           - Patient has a contact number available for                            emergencies. The  signs and symptoms of potential                            delayed complications were discussed with the                            patient. Return to normal activities tomorrow.                            Written discharge instructions were provided to the                            patient.                           - Resume previous diet.                           - Continue present medications.                           - Repeat colonoscopy date to be determined after                            pending pathology results are reviewed for                            surveillance.                           - Return to GI office (date not yet determined). Procedure Code(s):        --- Professional ---                           [phone], Colonoscopy, flexible; with removal of                            tumor(s), polyp(s), or other lesion(s) by snare  technique Diagnosis Code(s):        --- Professional ---                           Z86.010, Personal history of colonic polyps                           K63.5, Polyp of colon CPT copyright 2019 [employer]. All rights reserved. The codes documented in this report are preliminary and upon coder review may  be revised to meet current compliance requirements. Cristopher Estimable. Morrigan Wickens, MD Norvel Richards, MD 02/10/2020 1:56:03 PM This report has been signed electronically. Number of Addenda: 0

## 2020-02-10 NOTE — H&P (Signed)
@LOGO @   Primary Care Physician:  Doree Albee, MD Primary Gastroenterologist:  Dr. Gala Romney  Pre-Procedure History & Physical: HPI:  Crystal Mccarthy is a 64 y.o. female here for surveillance colonoscopy.  [place] (further information unknown) she was told to return in 5 years for surveillance examination.  Past Medical History:  Diagnosis Date  . Depression 05/01/2019  . Essential hypertension, benign Yes  . Hypothyroidism, adult 05/01/2019  . 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 Vitamin D deficiency disease 05/01/2019    Past Surgical History:  Procedure Laterality Date  . BREAST BIOPSY Right 06/2019   negative  . cataract surgery    . COLONOSCOPY  2016   [place]; per patient, 1 polyp with recommendations to repeat in 5 years  . COLONOSCOPY  2010   [place], per patient, normal exam    Prior to Admission medications   Medication Sig Start Date End Date Taking? Authorizing Provider  Ascorbic Acid (VITAMIN C) 1000 MG tablet Take 1,000 mg by mouth daily.   Yes [provider]  aspirin EC 81 MG tablet Take 81 mg by mouth at bedtime.    Yes [provider]  buPROPion (WELLBUTRIN XL) 300 MG 24 hr tablet Take 1 tablet (300 mg total) by mouth daily. 04/25/19  Yes Gosrani, Nimish C, MD  Cholecalciferol (VITAMIN D-3) 125 MCG (5000 UT) TABS Take 5,000 Units by mouth daily.    Yes [provider]  Coenzyme Q10 300 MG CAPS Take 300 mg by mouth at bedtime.    Yes [provider]  eletriptan (RELPAX) 40 MG tablet Take 1 tablet (40 mg total) by mouth as needed for migraine or headache. May repeat in 2 hours if headache persists or recurs. Patient taking differently: Take 40 mg by mouth daily as needed for migraine or headache. May repeat in 2 hours if headache persists or recurs.  04/24/19  Yes Gosrani, Nimish C, MD  estradiol (ESTRACE) 2 MG tablet Take 1 tablet (2 mg total) by mouth daily. 12/17/19   Yes Gosrani, Nimish C, MD  Krill Oil 500 MG CAPS Take 500 mg by mouth daily.   Yes [provider]  LORazepam (ATIVAN) 0.5 MG tablet TAKE 1 TABLET BY MOUTH ONCE DAILY AS NEEDED FOR ANXIETY Patient taking differently: Take 0.5 mg by mouth daily as needed for anxiety.  02/04/20  Yes Doree Albee, MD  losartan-hydrochlorothiazide (HYZAAR) 100-25 MG tablet Take 1 tablet by mouth once daily 02/09/20  Yes Gosrani, Nimish C, MD  Magnesium 500 MG TABS Take 500 mg by mouth daily.    Yes [provider]  Melatonin 3 MG CAPS Take 3 mg by mouth every evening.   Yes [provider]  pantoprazole (PROTONIX) 40 MG tablet Take 40 mg by mouth daily as needed (Heartburn).    Yes [provider]  PRESCRIPTION MEDICATION Apply 0.25 mLs topically daily. Testosterone 30 gm           Compound at [employer] 0.25 ml in the moning   Yes [provider]  Probiotic Product (ALIGN) 4 MG CAPS Take 4 mg by mouth daily.   Yes [provider]  progesterone (PROMETRIUM) 200 MG capsule Take 2 capsules (400 mg total) by mouth daily. Patient taking differently: Take 400 mg by mouth at bedtime.  11/12/19  Yes Gosrani, Nimish C, MD  Testosterone 20 % CREA Apply 2.5 mg topically daily.   Yes [provider]  thyroid (NP THYROID) 120 MG tablet Take 120 mg by mouth daily before breakfast. Take with 30 mg for a total of 150 mg   Yes [provider]  thyroid (NP THYROID) 30 MG tablet Take 1 tablet (30 mg total) by mouth daily before breakfast. Patient taking differently: Take 30 mg by mouth daily before breakfast. Take with 120 mg for a total of 150 mg 01/20/20  Yes Gosrani, Nimish C, MD  valACYclovir (VALTREX) 1000 MG tablet Take 1,000 mg by mouth daily as needed (Fever Blisters).    Yes [provider]  losartan-hydrochlorothiazide (HYZAAR) 100-25 MG tablet Take 1 tablet by mouth once daily Patient taking differently: Take 1 tablet by mouth  daily.  02/04/20  Yes Doree Albee, MD  NP THYROID 120 MG tablet TAKE 1 TABLET(120 MG) BY MOUTH DAILY BEFORE BREAKFAST 10/07/19   Doree Albee, MD    Allergies as of 12/23/2019 - Review Complete 10/30/2019  Allergen Reaction Noted  . Montelukast sodium Hives 09/11/2019    Family History  Problem Relation Age of Onset  . Dementia Mother   . Heart disease Father   . Parkinson's disease Father   . Obesity Sister   . Colon cancer Neg Hx     Social History   Socioeconomic History  . Marital status: Married    Spouse name: Not on file  . Number of children: Not on file  . Years of education: Not on file  . Highest education level: Not on file  Occupational History  . Not on file  Tobacco Use  . Smoking status: Never Smoker  . Smokeless tobacco: Never Used  Substance and Sexual Activity  . Alcohol use: Yes    Comment: occ- special occasions  . Drug use: Never  . Sexual activity: Yes  Other Topics Concern  . Not on file  Social History Narrative   Married for 55 years,lives with husband.Retired.   Social Determinants of Health   Financial Resource Strain:   . Difficulty of Paying Living Expenses: Not on file  Food Insecurity:   . Worried About Charity fundraiser in the Last Year: Not on file  . Ran Out of Food in the Last Year: Not on file  Transportation Needs:   . Lack of Transportation (Medical): Not on file  . Lack of Transportation (Non-Medical): Not on file  Physical Activity:   . Days of Exercise per Week: Not on file  . Minutes of Exercise per Session: Not on file  Stress:   . Feeling of Stress : Not on file  Social Connections:   . Frequency of Communication with Friends and Family: Not on file  . Frequency of Social Gatherings with Friends and Family: Not on file  . Attends Religious Services: Not on file  . Active Member of Clubs or Organizations: Not on file  . Attends Archivist Meetings: Not on file  . Marital Status: Not on file   Intimate Partner Violence:   . Fear of Current or Ex-Partner: Not on file  . Emotionally Abused: Not on file  . Physically Abused: Not on file  . Sexually Abused: Not on file    Review of Systems: See HPI, otherwise negative ROS  Physical Exam: BP 109/78   Temp 97.9 F (36.6 C) (Oral)   Resp 18   SpO2 97%  General:   Alert,  Well-developed, well-nourished, pleasant and cooperative in NAD SNeck:  Supple; no masses or thyromegaly. No significant cervical adenopathy.  Lungs:  Clear throughout to auscultation.   No wheezes, crackles, or rhonchi. No acute distress. Heart:  Regular rate and rhythm; no murmurs, clicks, rubs,  or gallops. Abdomen: Non-distended, normal bowel sounds.  Soft and nontender without appreciable mass or hepatosplenomegaly.  Pulses:  Normal pulses noted. Extremities:  Without clubbing or edema.  Impression/Plan: 64 year old lady with a history of colonic polyps removed elsewhere.  Here for surveillance colonoscopy for plan.  The risks, benefits, limitations, alternatives and imponderables have been reviewed with the patient. Questions have been answered. All parties are agreeable.      Notice: This dictation was prepared with Dragon dictation along with smaller phrase technology. Any transcriptional errors that result from this process are unintentional and may not be corrected upon review.

## 2020-02-11 ENCOUNTER — Telehealth: Payer: Self-pay | Admitting: Internal Medicine

## 2020-02-11 NOTE — Telephone Encounter (Signed)
Spoke with pt. Pt discussed not having gas after her procedure. Pt has had two other TCS and remembered staying in recovery the make sure she didn't have gas. Pt was pleased that she didn't have any gas after her procedure.

## 2020-02-11 NOTE — Telephone Encounter (Signed)
[phone]  PLEASE CALL PATIENT, HAS A TCS YESTERDAY AND IS HAVING SOME CONCERNS

## 2020-02-12 ENCOUNTER — Encounter: Payer: Self-pay | Admitting: Internal Medicine

## 2020-02-12 ENCOUNTER — Encounter (HOSPITAL_COMMUNITY): Payer: Self-pay | Admitting: Internal Medicine

## 2020-02-12 LAB — SURGICAL PATHOLOGY

## 2020-02-14 ENCOUNTER — Ambulatory Visit (HOSPITAL_COMMUNITY)
Admission: RE | Admit: 2020-02-14 | Discharge: 2020-02-14 | Disposition: A | Discharge status: Home / Self Care | Payer: Medicare HMO | Source: Ambulatory Visit | Attending: Orthopedic Surgery | Admitting: Orthopedic Surgery

## 2020-02-14 ENCOUNTER — Other Ambulatory Visit: Payer: Self-pay

## 2020-02-14 DIAGNOSIS — M4807 Spinal stenosis, lumbosacral region: Secondary | ICD-10-CM | POA: Diagnosis not present

## 2020-02-14 DIAGNOSIS — M48061 Spinal stenosis, lumbar region without neurogenic claudication: Secondary | ICD-10-CM | POA: Diagnosis not present

## 2020-02-14 DIAGNOSIS — M4319 Spondylolisthesis, multiple sites in spine: Secondary | ICD-10-CM | POA: Diagnosis not present

## 2020-02-14 DIAGNOSIS — M5126 Other intervertebral disc displacement, lumbar region: Secondary | ICD-10-CM | POA: Diagnosis not present

## 2020-02-18 ENCOUNTER — Telehealth (INDEPENDENT_AMBULATORY_CARE_PROVIDER_SITE_OTHER): Payer: Self-pay

## 2020-02-18 NOTE — Telephone Encounter (Signed)
That is unfortunate.  If she cannot take NP thyroid or [facility] Thyroid, we have no other options except levothyroxine which is not the medication I would prescribe for her as far as thyroid is concerned.  Let me know if she wants levothyroxine and I can prescribe it and let me know which pharmacy.

## 2020-02-19 ENCOUNTER — Encounter (INDEPENDENT_AMBULATORY_CARE_PROVIDER_SITE_OTHER): Payer: Self-pay | Admitting: Internal Medicine

## 2020-02-19 NOTE — Telephone Encounter (Signed)
Called and notified and left message on [place] was denied. Waiting to see what she will decide.

## 2020-02-20 ENCOUNTER — Ambulatory Visit (INDEPENDENT_AMBULATORY_CARE_PROVIDER_SITE_OTHER): Payer: Medicare HMO | Admitting: Orthopedic Surgery

## 2020-02-20 ENCOUNTER — Other Ambulatory Visit: Payer: Self-pay

## 2020-02-20 DIAGNOSIS — G8929 Other chronic pain: Secondary | ICD-10-CM

## 2020-02-20 DIAGNOSIS — M48061 Spinal stenosis, lumbar region without neurogenic claudication: Secondary | ICD-10-CM | POA: Diagnosis not present

## 2020-02-20 DIAGNOSIS — M5442 Lumbago with sciatica, left side: Secondary | ICD-10-CM | POA: Diagnosis not present

## 2020-02-20 DIAGNOSIS — M7062 Trochanteric bursitis, left hip: Secondary | ICD-10-CM | POA: Diagnosis not present

## 2020-02-20 NOTE — Progress Notes (Signed)
Chief Complaint  Patient presents with  . Results    go over MRI 02/14/20   Status post injection for bursitis left hip and an MRI was done to evaluate the lower back pain and changes we saw on the plain film  She still having some mild pain in her left hip but notices that her hip feels better and she can sequentially climb the stairs a little bit better  She does have pain when standing in place this is in her lower back and she has occasional numbness and pain down the left leg but her back pain improves after she is started to walk  F/u mri :   64 year old female who says I have always had trouble with my lower back.  Presents with left hip pain associated with knee and ankle pain which actually started in the ankle then localized over the left greater trochanter.  Patient is concerned that when she climbs the steps and put pressure and puts pressure on her left foot she has instability weakness in the left hip and leg   She does have some lower back pain which is intermittent but again has been chronic   She has been having the hip issue for the last month or 2 and when she saw her primary care and let them know about it he recommended a referral     Review of Systems  Constitutional: Negative for chills and fever.  Gastrointestinal: Negative.   Genitourinary: Negative.   Musculoskeletal: Positive for back pain.   Outside images were reviewed the spine looks severely diseased at all 5 levels with multiple areas of spinal stenosis and spinal deformity  Report of MRI IMPRESSION: 1. Curvature convex to the right with the apex at L2-3. 3 mm retrolisthesis L1-2. 6 mm chronic fixed anterolisthesis at L5-S1. 2. Discogenic endplate edema at T6-1 and L4-5 which could relate to back pain. 3. No compressive central canal stenosis. Lateral recess and foraminal narrowing as outlined above. Chronic bilateral foraminal narrowing at L5-S1 right more than left, fixed.     Electronically  Signed   By: Nelson Chimes M.D.   On: 02/14/2020 13:04   Encounter Diagnoses  Name Primary?  . Chronic left-sided low back pain with left-sided sciatica Yes  . Spinal stenosis of lumbar region without neurogenic claudication   . Trochanteric bursitis, left hip       Recommend referral to neurosurgery for definitive management

## 2020-02-20 NOTE — Patient Instructions (Signed)
Referral

## 2020-02-27 ENCOUNTER — Telehealth: Payer: Self-pay | Admitting: Orthopedic Surgery

## 2020-02-27 NOTE — Telephone Encounter (Signed)
Rollene Fare called and left [place] a message to call her back about this patient referral. The number is [phone] and press option 0 for the operator to page [place]. Please advise.

## 2020-02-28 DIAGNOSIS — Z0100 Encounter for examination of eyes and vision without abnormal findings: Secondary | ICD-10-CM | POA: Diagnosis not present

## 2020-03-02 DIAGNOSIS — I10 Essential (primary) hypertension: Secondary | ICD-10-CM | POA: Diagnosis not present

## 2020-03-02 DIAGNOSIS — M48061 Spinal stenosis, lumbar region without neurogenic claudication: Secondary | ICD-10-CM | POA: Diagnosis not present

## 2020-03-02 DIAGNOSIS — Z6828 Body mass index (BMI) 28.0-28.9, adult: Secondary | ICD-10-CM | POA: Diagnosis not present

## 2020-03-13 ENCOUNTER — Other Ambulatory Visit (INDEPENDENT_AMBULATORY_CARE_PROVIDER_SITE_OTHER): Payer: Self-pay | Admitting: Internal Medicine

## 2020-03-18 ENCOUNTER — Ambulatory Visit (HOSPITAL_COMMUNITY): Payer: Medicare HMO | Attending: Neurosurgery | Admitting: Physical Therapy

## 2020-03-18 ENCOUNTER — Other Ambulatory Visit: Payer: Self-pay

## 2020-03-18 ENCOUNTER — Encounter (HOSPITAL_COMMUNITY): Payer: Self-pay | Admitting: Physical Therapy

## 2020-03-18 DIAGNOSIS — R2689 Other abnormalities of gait and mobility: Secondary | ICD-10-CM | POA: Diagnosis not present

## 2020-03-18 DIAGNOSIS — M545 Low back pain, unspecified: Secondary | ICD-10-CM | POA: Diagnosis not present

## 2020-03-18 DIAGNOSIS — M6281 Muscle weakness (generalized): Secondary | ICD-10-CM | POA: Diagnosis not present

## 2020-03-18 DIAGNOSIS — M542 Cervicalgia: Secondary | ICD-10-CM

## 2020-03-18 DIAGNOSIS — R29898 Other symptoms and signs involving the musculoskeletal system: Secondary | ICD-10-CM

## 2020-03-18 NOTE — Therapy (Signed)
[facility] [address] [place], [place], [address] Phone: [phone]   Fax:  [phone]  Physical Therapy Evaluation  Patient Details  Name: Crystal Mccarthy MRN: [id number] Date of Birth: 1956/04/11 Referring Provider (PT): Duffy Rhody MD   Encounter Date: 03/18/2020   PT End of Session - 03/18/20 1609    Visit Number 1    Number of Visits 12    Date for PT Re-Evaluation 04/29/20    Authorization Type [employer] (no auth, no visit limit)    Progress Note Due on Visit 10    PT Start Time 1515    PT Stop Time 1604    PT Time Calculation (min) 49 min    Activity Tolerance Patient tolerated treatment well    Behavior During Therapy [facility] for tasks assessed/performed           Past Medical History:  Diagnosis Date  . Depression 05/01/2019  . Essential hypertension, benign Yes  . Hypothyroidism, adult 05/01/2019  . 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 Vitamin D deficiency disease 05/01/2019    Past Surgical History:  Procedure Laterality Date  . BREAST BIOPSY Right 06/2019   negative  . cataract surgery    . COLONOSCOPY  2016   [place]; per patient, 1 polyp with recommendations to repeat in 5 years  . COLONOSCOPY  2010   [place], per patient, normal exam  . COLONOSCOPY WITH PROPOFOL N/A 02/10/2020   Procedure: COLONOSCOPY WITH PROPOFOL;  Surgeon: Daneil Dolin, MD;  Location: [facility];  Service: Endoscopy;  Laterality: N/A;  1:30pm  . POLYPECTOMY  02/10/2020   Procedure: POLYPECTOMY;  Surgeon: Daneil Dolin, MD;  Location: [facility];  Service: Endoscopy;;    There were no vitals filed for this visit.    Subjective Assessment - 03/18/20 1513    Subjective Patient is a 64 y.o. female who presents to physical therapy with referral for lumbar stenosis. Patient states it has been months since symptoms started. It started with her ankle and then to her knee and then her L hip. She has to do  stairs one step at a time. The more exercise she does, the less pain she has. She has increased pain with walking up stairs and up hills. She is not currently having pain in her ankle but has continued L knee and L hip symptoms. She was having pain at night but it has not been bad. She has history of low back problems. She does pelvic tilts which helps and she is careful with what she lifts. She worked with [employer] which helped. She has intermittent pain in thoracic spine with using her iPad. Her main goal is to decrease pain. She also has neck pain which really bothers her.    Pertinent History low back pain    Limitations Other (comment)   ascending stairs and hills   Patient Stated Goals decrease pain    Currently in Pain? Yes    Pain Score 2     Pain Location Hip    Pain Orientation Left              [facility] PT Assessment - 03/18/20 0001      Assessment   Medical Diagnosis Lumbar Stenosis    Referring Provider (PT) Duffy Rhody MD    Onset Date/Surgical Date 07/20/19    Next MD Visit 2 months    Prior Therapy Yes back      Precautions  Precautions None      Restrictions   Weight Bearing Restrictions No      Balance Screen   Has the patient fallen in the past 6 months No    Has the patient had a decrease in activity level because of a fear of falling?  No    Is the patient reluctant to leave their home because of a fear of falling?  No      Prior Function   Level of Independence Independent    Leisure golf      Cognition   Overall Cognitive Status Within Functional Limits for tasks assessed      Observation/Other Assessments   Observations Ambulates without AD    Focus on Therapeutic Outcomes (FOTO)  not completed due to more hip symptoms      Sensation   Light Touch Appears Intact      ROM / Strength   AROM / PROM / Strength AROM;Strength      AROM   AROM Assessment Site Lumbar    Lumbar Flexion 0% limited    Lumbar Extension 25% limited    Lumbar - Right  Side Bend 0% limited    Lumbar - Left Side Bend 0% limited    Lumbar - Right Rotation 0% limited    Lumbar - Left Rotation 0% limited      Strength   Strength Assessment Site Hip;Knee;Ankle    Right/Left Hip Right;Left    Right Hip Flexion 4+/5    Right Hip Extension 4/5    Right Hip ABduction 4+/5    Left Hip Flexion 4+/5    Left Hip Extension 4/5    Left Hip ABduction 4/5    Right/Left Knee Right;Left    Right Knee Flexion 5/5    Right Knee Extension 5/5    Left Knee Flexion 5/5    Left Knee Extension 5/5    Right/Left Ankle Right;Left    Right Ankle Dorsiflexion 5/5    Left Ankle Dorsiflexion 5/5      Palpation   Spinal mobility grossly hypomoble and tender thoracic and lumbar spine    Palpation comment TTP L glutes and piriformis, no tenderness R hip, lumbar paraspinals      Ambulation/Gait   Ambulation/Gait Yes    Ambulation/Gait Assistance 7: Independent    Ambulation Distance (Feet) 435 Feet    Assistive device None    Gait Pattern Within Functional Limits    Ambulation Surface Level;Indoor    Stairs Yes    Stair Management Technique Alternating pattern    Gait Comments 2 MWT; pain with ascending stairs, no deficits decending or pain                      Objective measurements completed on examination: See above findings.               PT Education - 03/18/20 1513    Education Details Patient educated on exam findings, POC, scope of PT, walking, yoga, imaging related to pain    Person(s) Educated Patient    Methods Explanation;Demonstration    Comprehension Verbalized understanding;Returned demonstration            PT Short Term Goals - 03/18/20 1616      PT SHORT TERM GOAL #1   Title Patient will be independent with HEP in order to improve functional outcomes.    Time 3    Period Weeks    Status New    Target Date  04/08/20      PT SHORT TERM GOAL #2   Title Patient will report at least 25% improvement in symptoms for  improved quality of life.    Time 3    Period Weeks    Status New    Target Date 04/08/20             PT Long Term Goals - 03/18/20 1616      PT LONG TERM GOAL #1   Title Patient will report at least 75% improvement in symptoms for improved quality of life.    Time 6    Period Weeks    Status New    Target Date 04/29/20      PT LONG TERM GOAL #2   Title Patient will be able to navigate stairs with reciprocal pattern without compensation or pain in order to demonstrate improved LE strength.    Time 6    Period Weeks    Status New    Target Date 04/29/20      PT LONG TERM GOAL #3   Title Patient will be able to stand for at least 20 minutes with symptoms no greater than 2/10 for improved tolerance to standing.    Time 6    Period Weeks    Status New    Target Date 04/29/20                  Plan - 03/18/20 1610    Clinical Impression Statement Patient is a 64 y.o. female who presents to physical therapy with referral for lumbar stenosis with c/o hip and LLE pain and neck pain. She presents with pain limited deficits in L hip, lumbar and LLE strength, ROM, endurance, stair navigation, spinal mobility and functional mobility with ADL. She is having to modify and restrict ADL as indicated by subjective information and objective measures which is affecting overall participation. Patient will benefit from skilled physical therapy in order to improve function and reduce impairment.    Personal Factors and Comorbidities Age;Comorbidity 2;Past/Current Experience;Social Background;Time since onset of injury/illness/exacerbation    Comorbidities htn, depression    Examination-Activity Limitations Stairs;Stand;Sleep    Examination-Participation Restrictions Cleaning;Shop    Stability/Clinical Decision Making Evolving/Moderate complexity    Clinical Decision Making Moderate    Rehab Potential Good    PT Frequency 2x / week    PT Duration 6 weeks    PT Treatment/Interventions  ADLs/Self Care Home Management;Aquatic Therapy;Cryotherapy;Electrical Stimulation;Iontophoresis 4mg /ml Dexamethasone;Moist Heat;Traction;DME Instruction;Ultrasound;Gait training;Stair training;Functional mobility training;Therapeutic activities;Therapeutic exercise;Balance training;Orthotic Fit/Training;Patient/family education;Neuromuscular re-education;Manual techniques;Dry needling;Energy conservation;Splinting;Spinal Manipulations;Joint Manipulations    PT Next Visit Plan possibly further assess hip mobility, begin hip/LLE strengthening with emphasis on glutes, begin core strengthening;    PT Home Exercise Plan initiate next session    Consulted and Agree with Plan of Care Patient           Patient will benefit from skilled therapeutic intervention in order to improve the following deficits and impairments:  Pain, Decreased strength, Decreased mobility, Improper body mechanics, Hypomobility, Decreased activity tolerance, Increased muscle spasms, Decreased endurance  Visit Diagnosis: Muscle weakness (generalized)  Other abnormalities of gait and mobility  Low back pain, unspecified back pain laterality, unspecified chronicity, unspecified whether sciatica present  Other symptoms and signs involving the musculoskeletal system  Cervicalgia     Problem List Patient Active Problem List   Diagnosis Date Noted  . History of colonic polyps 10/30/2019  . Hypothyroidism, adult 05/01/2019  . Vitamin D deficiency disease 05/01/2019  .  Essential hypertension, benign 05/01/2019  . 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 Depression 05/01/2019   4:19 PM, 03/18/20 Mearl Latin PT, DPT Physical Therapist at [facility] [place], [place], [address] Phone: [phone]   Fax:  [phone]  Name: Crystal Mccarthy MRN: [id number] Date of Birth: 01-14-1956

## 2020-03-22 ENCOUNTER — Other Ambulatory Visit (INDEPENDENT_AMBULATORY_CARE_PROVIDER_SITE_OTHER): Payer: Self-pay | Admitting: Internal Medicine

## 2020-03-25 ENCOUNTER — Encounter (HOSPITAL_COMMUNITY): Payer: Self-pay | Admitting: Physical Therapy

## 2020-03-25 ENCOUNTER — Ambulatory Visit (HOSPITAL_COMMUNITY): Payer: Medicare HMO | Admitting: Physical Therapy

## 2020-03-25 ENCOUNTER — Other Ambulatory Visit: Payer: Self-pay

## 2020-03-25 DIAGNOSIS — M6281 Muscle weakness (generalized): Secondary | ICD-10-CM | POA: Diagnosis not present

## 2020-03-25 DIAGNOSIS — M545 Low back pain, unspecified: Secondary | ICD-10-CM

## 2020-03-25 DIAGNOSIS — R2689 Other abnormalities of gait and mobility: Secondary | ICD-10-CM | POA: Diagnosis not present

## 2020-03-25 DIAGNOSIS — M542 Cervicalgia: Secondary | ICD-10-CM

## 2020-03-25 DIAGNOSIS — R29898 Other symptoms and signs involving the musculoskeletal system: Secondary | ICD-10-CM | POA: Diagnosis not present

## 2020-03-25 NOTE — Therapy (Signed)
[place], [place], [address] Phone: [phone]   Fax:  [phone]  Physical Therapy Treatment  Patient Details  Name: Crystal Mccarthy MRN: [id number] Date of Birth: 07/29/1955 Referring Provider (PT): Duffy Rhody MD   Encounter Date: 03/25/2020   PT End of Session - 03/25/20 1048    Visit Number 2    Number of Visits 12    Date for PT Re-Evaluation 04/29/20    Authorization Type [employer] Medicare (no auth, no visit limit)    Progress Note Due on Visit 10    PT Start Time 1050    PT Stop Time 1130    PT Time Calculation (min) 40 min    Activity Tolerance Patient tolerated treatment well    Behavior During Therapy [facility] for tasks assessed/performed           Past Medical History:  Diagnosis Date  . Depression 05/01/2019  . Essential hypertension, benign Yes  . Hypothyroidism, adult 05/01/2019  . 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 Vitamin D deficiency disease 05/01/2019    Past Surgical History:  Procedure Laterality Date  . BREAST BIOPSY Right 06/2019   negative  . cataract surgery    . COLONOSCOPY  2016   [place]; per patient, 1 polyp with recommendations to repeat in 5 years  . COLONOSCOPY  2010   [place], per patient, normal exam  . COLONOSCOPY WITH PROPOFOL N/A 02/10/2020   Procedure: COLONOSCOPY WITH PROPOFOL;  Surgeon: Daneil Dolin, MD;  [facility];  Service: Endoscopy;  Laterality: N/A;  1:30pm  . POLYPECTOMY  02/10/2020   Procedure: POLYPECTOMY;  Surgeon: Daneil Dolin, MD;  [facility];  Service: Endoscopy;;    There were no vitals filed for this visit.   Subjective Assessment - 03/25/20 1051    Subjective Patient states she is doing pretty much the same. She states golfing it has been bothering her less. But still painful with stairs.    Pertinent History low back pain    Limitations Other (comment)   ascending stairs and hills   Patient  Stated Goals decrease pain    Currently in Pain? Yes    Pain Score 1     Pain Location Hip    Pain Orientation Left                             [facility] Adult PT Treatment/Exercise - 03/25/20 0001      Exercises   Exercises Knee/Hip      Knee/Hip Exercises: Stretches   Piriformis Stretch 3 reps;20 seconds    Other Knee/Hip Stretches SKTC 5 x 10 second holds    Other Knee/Hip Stretches pigeon stretch 3x30 second hold      Knee/Hip Exercises: Standing   Functional Squat 10 reps    Functional Squat Limitations 2 sets      Knee/Hip Exercises: Supine   Bridges Both;1 set;5 reps    Bridges Limitations 5 second holds      Knee/Hip Exercises: Prone   Hip Extension Both;2 sets;10 reps                  PT Education - 03/25/20 1054    Education Details Patient educated on HEP, exercise mechanics    Person(s) Educated Patient    Methods Explanation;Demonstration    Comprehension Verbalized understanding;Returned demonstration  PT Short Term Goals - 03/18/20 1616      PT SHORT TERM GOAL #1   Title Patient will be independent with HEP in order to improve functional outcomes.    Time 3    Period Weeks    Status New    Target Date 04/08/20      PT SHORT TERM GOAL #2   Title Patient will report at least 25% improvement in symptoms for improved quality of life.    Time 3    Period Weeks    Status New    Target Date 04/08/20             PT Long Term Goals - 03/18/20 1616      PT LONG TERM GOAL #1   Title Patient will report at least 75% improvement in symptoms for improved quality of life.    Time 6    Period Weeks    Status New    Target Date 04/29/20      PT LONG TERM GOAL #2   Title Patient will be able to navigate stairs with reciprocal pattern without compensation or pain in order to demonstrate improved LE strength.    Time 6    Period Weeks    Status New    Target Date 04/29/20      PT LONG TERM GOAL #3   Title  Patient will be able to stand for at least 20 minutes with symptoms no greater than 2/10 for improved tolerance to standing.    Time 6    Period Weeks    Status New    Target Date 04/29/20                 Plan - 03/25/20 1050    Clinical Impression Statement Patient feels minimal stretch with SKTC exercise but with manual and verbal cueing patient performs piriformis stretch which targets symptom region. Patient tolerates pigeon stretch which targets painful area very well. Patient requires min cueing for decreasing excessive lumbar motion with hip extension exercise with good carry over. Patient demonstrating slight weight shift off LLE with squatting and also atrophy of L glutes. Mechanics improve with cueing to good depth. Patient will continue to benefit from skilled physical therapy in order to reduce impairment and improve function.    Personal Factors and Comorbidities Age;Comorbidity 2;Past/Current Experience;Social Background;Time since onset of injury/illness/exacerbation    Comorbidities htn, depression    Examination-Activity Limitations Stairs;Stand;Sleep    Examination-Participation Restrictions Cleaning;Shop    Stability/Clinical Decision Making Evolving/Moderate complexity    Rehab Potential Good    PT Frequency 2x / week    PT Duration 6 weeks    PT Treatment/Interventions ADLs/Self Care Home Management;Aquatic Therapy;Cryotherapy;Electrical Stimulation;Iontophoresis 4mg /ml Dexamethasone;Moist Heat;Traction;DME Instruction;Ultrasound;Gait training;Stair training;Functional mobility training;Therapeutic activities;Therapeutic exercise;Balance training;Orthotic Fit/Training;Patient/family education;Neuromuscular re-education;Manual techniques;Dry needling;Energy conservation;Splinting;Spinal Manipulations;Joint Manipulations    PT Next Visit Plan continue hip/LLE strengthening with emphasis on glutes, begin core strengthening;    PT Home Exercise Plan 10/13 hip ext, squat,  pigeon str, piriformis str, SKTC    Consulted and Agree with Plan of Care Patient           Patient will benefit from skilled therapeutic intervention in order to improve the following deficits and impairments:  Pain, Decreased strength, Decreased mobility, Improper body mechanics, Hypomobility, Decreased activity tolerance, Increased muscle spasms, Decreased endurance  Visit Diagnosis: Muscle weakness (generalized)  Other abnormalities of gait and mobility  Low back pain, unspecified back pain laterality, unspecified chronicity, unspecified whether sciatica  present  Other symptoms and signs involving the musculoskeletal system  Cervicalgia     Problem List Patient Active Problem List   Diagnosis Date Noted  . History of colonic polyps 10/30/2019  . Hypothyroidism, adult 05/01/2019  . Vitamin D deficiency disease 05/01/2019  . Essential hypertension, benign 05/01/2019  . 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 Depression 05/01/2019   11:34 AM, 03/25/20 Mearl Latin PT, DPT Physical Therapist at [place], [place], [address] Phone: [phone]   Fax:  [phone]  Name: Crystal Mccarthy MRN: [id number] Date of Birth: [birth year]

## 2020-03-25 NOTE — Patient Instructions (Signed)
Access Code: [email or web address] URL: [email or web address] Date: 03/25/2020 Prepared by: Mitzi Hansen Del Wiseman  Exercises Hooklying Single Knee to Chest Stretch - 1 x daily - 7 x weekly - 5 reps - 10 second hold Supine Piriformis Stretch with Leg Straight - 1 x daily - 7 x weekly - 3 reps - 30 second hold Hip External Rotation Stretch (Piriformis Stretch, Quadruped Figure 4 Stretch) - 1 x daily - 7 x weekly - 3 reps - 30 second hold Prone Hip Extension - 1 x daily - 7 x weekly - 2 sets - 10 reps Squat with Counter Support - 1 x daily - 7 x weekly - 2 sets - 10 reps

## 2020-03-26 ENCOUNTER — Ambulatory Visit (HOSPITAL_COMMUNITY): Payer: Medicare HMO | Admitting: Physical Therapy

## 2020-03-27 ENCOUNTER — Encounter (HOSPITAL_COMMUNITY): Payer: Medicare HMO | Admitting: Physical Therapy

## 2020-03-30 ENCOUNTER — Telehealth (HOSPITAL_COMMUNITY): Payer: Self-pay | Admitting: Physical Therapy

## 2020-03-30 ENCOUNTER — Ambulatory Visit (HOSPITAL_COMMUNITY): Payer: Medicare HMO | Admitting: Physical Therapy

## 2020-03-30 NOTE — Telephone Encounter (Signed)
pt called to cx due to her migranes and she called in time vm was checked late

## 2020-04-01 ENCOUNTER — Telehealth (INDEPENDENT_AMBULATORY_CARE_PROVIDER_SITE_OTHER): Payer: Medicare HMO | Admitting: Internal Medicine

## 2020-04-01 ENCOUNTER — Other Ambulatory Visit (INDEPENDENT_AMBULATORY_CARE_PROVIDER_SITE_OTHER): Payer: Self-pay | Admitting: Internal Medicine

## 2020-04-01 ENCOUNTER — Encounter (INDEPENDENT_AMBULATORY_CARE_PROVIDER_SITE_OTHER): Payer: Self-pay | Admitting: Internal Medicine

## 2020-04-01 ENCOUNTER — Telehealth (INDEPENDENT_AMBULATORY_CARE_PROVIDER_SITE_OTHER): Payer: Self-pay | Admitting: Internal Medicine

## 2020-04-01 DIAGNOSIS — R519 Headache, unspecified: Secondary | ICD-10-CM | POA: Diagnosis not present

## 2020-04-01 MED ORDER — IBUPROFEN 800 MG PO TABS: 800.0000 mg | ORAL_TABLET | Freq: Three times a day (TID) | ORAL | 0 refills | Status: DC | PRN

## 2020-04-01 MED ORDER — PREDNISONE 20 MG PO TABS: 40.0000 mg | ORAL_TABLET | Freq: Every day | ORAL | 1 refills | Status: DC

## 2020-04-01 MED ORDER — LORAZEPAM 0.5 MG PO TABS
ORAL_TABLET | ORAL | 0 refills | Status: DC
Start: 2020-04-01 — End: 2020-08-26

## 2020-04-01 NOTE — Telephone Encounter (Signed)
Okay, done

## 2020-04-01 NOTE — Progress Notes (Signed)
Metrics: Intervention Frequency ACO  Documented Smoking Status Yearly  Screened one or more times in 24 months  Cessation Counseling or  Active cessation medication Past 24 months  Past 24 months   Guideline developer: UpToDate (See UpToDate for funding source) Date Released: 2014       Wellness Office Visit  Subjective:  Patient ID: Crystal Mccarthy, female    DOB: 1955/07/10  Age: 64 y.o. MRN: [id number]  CC: This is an audio telemedicine visit with the permission of the patient who is at home and I am in my office.  I used 2 identifiers to identify the patient. Headache. HPI  The patient normally has migrainous headaches from time to time and uses Relpax.  On this occasion, the headache started 5 days ago and is mostly on the left side associated with some sensitivity to light.  She denies any nausea on this occasion.  She has tried Relpax but to no effect.  She denies any other neurological symptoms such as visual changes, speech changes, or limb weakness, altered mentation. Past Medical History:  Diagnosis Date  . Depression 05/01/2019  . Essential hypertension, benign Yes  . Hypothyroidism, adult 05/01/2019  . 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 Vitamin D deficiency disease 05/01/2019   Past Surgical History:  Procedure Laterality Date  . BREAST BIOPSY Right 06/2019   negative  . cataract surgery    . COLONOSCOPY  2016   [place]; per patient, 1 polyp with recommendations to repeat in 5 years  . COLONOSCOPY  2010   [place], per patient, normal exam  . COLONOSCOPY WITH PROPOFOL N/A 02/10/2020   Procedure: COLONOSCOPY WITH PROPOFOL;  Surgeon: Daneil Dolin, MD;  [facility];  Service: Endoscopy;  Laterality: N/A;  1:30pm  . POLYPECTOMY  02/10/2020   Procedure: POLYPECTOMY;  Surgeon: Daneil Dolin, MD;  [facility];  Service: Endoscopy;;     Family History  Problem Relation Age of Onset  . Dementia Mother   . Heart disease  Father   . Parkinson's disease Father   . Obesity Sister   . Colon cancer Neg Hx     Social History   Social History Narrative   Married for 55 years,lives with husband.Retired.   Social History   Tobacco Use  . Smoking status: Never Smoker  . Smokeless tobacco: Never Used  Substance Use Topics  . Alcohol use: Yes    Comment: occ- special occasions    Current Meds  Medication Sig  . Ascorbic Acid (VITAMIN C) 1000 MG tablet Take 1,000 mg by mouth daily.  Marland Kitchen aspirin EC 81 MG tablet Take 81 mg by mouth at bedtime.   Marland Kitchen buPROPion (WELLBUTRIN XL) 300 MG 24 hr tablet Take 1 tablet (300 mg total) by mouth daily.  . Cholecalciferol (VITAMIN D-3) 125 MCG (5000 UT) TABS Take 5,000 Units by mouth daily.   . Coenzyme Q10 300 MG CAPS Take 300 mg by mouth at bedtime.   Marland Kitchen eletriptan (RELPAX) 40 MG tablet take 1 tablet by mouth as needed for migraine or headache *may repeat in 2 hours if headache persists or recurs*  . estradiol (ESTRACE) 2 MG tablet TAKE ONE TABLET BY MOUTH ONE TIME DAILY  . Krill Oil 500 MG CAPS Take 500 mg by mouth daily.  Marland Kitchen LORazepam (ATIVAN) 0.5 MG tablet TAKE 1 TABLET BY MOUTH ONCE DAILY AS NEEDED FOR ANXIETY (Patient taking differently: Take 0.5 mg by mouth daily as needed for anxiety. )  .  losartan-hydrochlorothiazide (HYZAAR) 100-25 MG tablet Take 1 tablet by mouth once daily  . Magnesium 500 MG TABS Take 500 mg by mouth daily.   . Melatonin 3 MG CAPS Take 3 mg by mouth every evening.  . pantoprazole (PROTONIX) 40 MG tablet Take 40 mg by mouth daily as needed (Heartburn).   Marland Kitchen PRESCRIPTION MEDICATION Apply 0.25 mLs topically daily. Testosterone 30 gm           Compound at [employer] 0.25 ml in the moning  . Probiotic Product (ALIGN) 4 MG CAPS Take 4 mg by mouth daily.  . progesterone (PROMETRIUM) 200 MG capsule Take 2 capsules (400 mg total) by mouth daily. (Patient taking differently: Take 400 mg by mouth at bedtime. )  . Testosterone 20 % CREA Apply 2.5  mg topically daily.  Marland Kitchen thyroid (NP THYROID) 120 MG tablet Take 120 mg by mouth daily before breakfast. Take with 30 mg for a total of 150 mg  . thyroid (NP THYROID) 30 MG tablet Take 1 tablet (30 mg total) by mouth daily before breakfast. (Patient taking differently: Take 30 mg by mouth daily before breakfast. Take with 120 mg for a total of 150 mg)  . valACYclovir (VALTREX) 1000 MG tablet Take 1,000 mg by mouth daily as needed (Fever Blisters).       Depression screen [facility] 2/9 09/11/2019  Decreased Interest 0  Down, Depressed, Hopeless 0  PHQ - 2 Score 0     Objective:   Today's Vitals: There were no vitals taken for this visit. Vitals with BMI 04/01/2020 02/10/2020 02/10/2020  Height (No Data) - -  Weight (No Data) - -  BMI - - -  Systolic (No Data) 761 950  Diastolic (No Data) 85 78  Pulse - 65 -     Physical Exam   Her speech is completely normal on the phone and she does appear to be alert and orientated.    Assessment   1. Acute intractable headache, unspecified headache type       Tests ordered No orders of the defined types were placed in this encounter.    Plan: 1. This may be somewhat of an atypical migrainous headache for her but nonetheless may be migrainous in nature.  Since Relpax has not helped her, I am going to send over prescription strength ibuprofen which I have told her she can only take for maximum of 3 days and also a short course of prednisone for 5 days and explained possible side effects. 2. If she is not improving by tomorrow, I have told her to call the office and I probably will need to examine her. 3. This phone call lasted 8 minutes and 57 seconds.   Meds ordered this encounter  Medications  . ibuprofen (ADVIL) 800 MG tablet    Sig: Take 1 tablet (800 mg total) by mouth every 8 (eight) hours as needed.    Dispense:  30 tablet    Refill:  0  . predniSONE (DELTASONE) 20 MG tablet    Sig: Take 2 tablets (40 mg total) by mouth daily  with breakfast.    Dispense:  10 tablet    Refill:  1    Kyrus Hyde Luther Parody, MD

## 2020-04-02 ENCOUNTER — Ambulatory Visit (HOSPITAL_COMMUNITY): Payer: Medicare HMO | Admitting: Physical Therapy

## 2020-04-02 ENCOUNTER — Encounter (HOSPITAL_COMMUNITY): Payer: Self-pay | Admitting: Physical Therapy

## 2020-04-02 ENCOUNTER — Other Ambulatory Visit: Payer: Self-pay

## 2020-04-02 DIAGNOSIS — R2689 Other abnormalities of gait and mobility: Secondary | ICD-10-CM | POA: Diagnosis not present

## 2020-04-02 DIAGNOSIS — M542 Cervicalgia: Secondary | ICD-10-CM

## 2020-04-02 DIAGNOSIS — M545 Low back pain, unspecified: Secondary | ICD-10-CM | POA: Diagnosis not present

## 2020-04-02 DIAGNOSIS — M6281 Muscle weakness (generalized): Secondary | ICD-10-CM | POA: Diagnosis not present

## 2020-04-02 DIAGNOSIS — R29898 Other symptoms and signs involving the musculoskeletal system: Secondary | ICD-10-CM | POA: Diagnosis not present

## 2020-04-02 NOTE — Therapy (Signed)
[place] [address] [place], [place], [address] Phone: [phone]   Fax:  [phone]  Physical Therapy Treatment  Patient Details  Name: Crystal Mccarthy MRN: [id number] Date of Birth: Nov 02, 1955 Referring Provider (PT): Duffy Rhody MD   Encounter Date: 04/02/2020   PT End of Session - 04/02/20 1526    Visit Number 3    Number of Visits 12    Date for PT Re-Evaluation 04/29/20    Authorization Type Aetna Medicare (no auth, no visit limit)    Progress Note Due on Visit 10    PT Start Time 1527    PT Stop Time 1607    PT Time Calculation (min) 40 min    Activity Tolerance Patient tolerated treatment well    Behavior During Therapy [facility] for tasks assessed/performed           Past Medical History:  Diagnosis Date  . Depression 05/01/2019  . Essential hypertension, benign Yes  . Hypothyroidism, adult 05/01/2019  . 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 Vitamin D deficiency disease 05/01/2019    Past Surgical History:  Procedure Laterality Date  . BREAST BIOPSY Right 06/2019   negative  . cataract surgery    . COLONOSCOPY  2016   [place]; per patient, 1 polyp with recommendations to repeat in 5 years  . COLONOSCOPY  2010   [place], per patient, normal exam  . COLONOSCOPY WITH PROPOFOL N/A 02/10/2020   Procedure: COLONOSCOPY WITH PROPOFOL;  Surgeon: Daneil Dolin, MD;  [facility];  Service: Endoscopy;  Laterality: N/A;  1:30pm  . POLYPECTOMY  02/10/2020   Procedure: POLYPECTOMY;  Surgeon: Daneil Dolin, MD;  [facility];  Service: Endoscopy;;    There were no vitals filed for this visit.   Subjective Assessment - 04/02/20 1527    Subjective Patient had a migraine over the weekend. She is feeling some better today. She played golf today. She is still having the same symptoms with climbing stairs. Her home exercises are going well.    Pertinent History low back pain     Limitations Other (comment)   ascending stairs and hills   Patient Stated Goals decrease pain    Currently in Pain? Yes    Pain Score 1     Pain Location Hip    Pain Orientation Left                             [facility] Adult PT Treatment/Exercise - 04/02/20 0001      Knee/Hip Exercises: Standing   Hip Flexion Both;2 sets;10 reps    Hip Flexion Limitations with glute activation    Lateral Step Up Left;2 sets;10 reps;Hand Hold: 1;Step Height: 4"    Forward Step Up Left;10 reps;2 sets;Hand Hold: 1;Step Height: 4"      Knee/Hip Exercises: Supine   Other Supine Knee/Hip Exercises Ab sets 10x 5 second holds    Other Supine Knee/Hip Exercises dead bug isometrics with ball 2x7                  PT Education - 04/02/20 1531    Education Details Patient educated on HEP, exercise mechanics, DOMS, ways to cope with DOMS    Person(s) Educated Patient    Methods Explanation;Demonstration    Comprehension Verbalized understanding;Returned demonstration            PT Short Term Goals - 03/18/20 1616  PT SHORT TERM GOAL #1   Title Patient will be independent with HEP in order to improve functional outcomes.    Time 3    Period Weeks    Status New    Target Date 04/08/20      PT SHORT TERM GOAL #2   Title Patient will report at least 25% improvement in symptoms for improved quality of life.    Time 3    Period Weeks    Status New    Target Date 04/08/20             PT Long Term Goals - 03/18/20 1616      PT LONG TERM GOAL #1   Title Patient will report at least 75% improvement in symptoms for improved quality of life.    Time 6    Period Weeks    Status New    Target Date 04/29/20      PT LONG TERM GOAL #2   Title Patient will be able to navigate stairs with reciprocal pattern without compensation or pain in order to demonstrate improved LE strength.    Time 6    Period Weeks    Status New    Target Date 04/29/20      PT LONG TERM GOAL #3    Title Patient will be able to stand for at least 20 minutes with symptoms no greater than 2/10 for improved tolerance to standing.    Time 6    Period Weeks    Status New    Target Date 04/29/20                 Plan - 04/02/20 1526    Clinical Impression Statement Patient educated on DOMs and ways to reduce soreness following exercise since she was sore after squats last session. Patient with difficulty achieving TRA activation but is able to perform with breathing techniques. Patient performs dead bug iso with c/o low back discomfort which improves with cueing for positional changes. Patient able to complete stair exercises without symptoms with good motor control with cueing for slow, controlled movements for improved muscle activation. Patient performs marches well with cueing for level hips and prior glute activation for stability. Patient will continue to benefit from skilled physical therapy in order to reduce impairment and improve function.    Personal Factors and Comorbidities Age;Comorbidity 2;Past/Current Experience;Social Background;Time since onset of injury/illness/exacerbation    Comorbidities htn, depression    Examination-Activity Limitations Stairs;Stand;Sleep    Examination-Participation Restrictions Cleaning;Shop    Stability/Clinical Decision Making Evolving/Moderate complexity    Rehab Potential Good    PT Frequency 2x / week    PT Duration 6 weeks    PT Treatment/Interventions ADLs/Self Care Home Management;Aquatic Therapy;Cryotherapy;Electrical Stimulation;Iontophoresis 4mg /ml Dexamethasone;Moist Heat;Traction;DME Instruction;Ultrasound;Gait training;Stair training;Functional mobility training;Therapeutic activities;Therapeutic exercise;Balance training;Orthotic Fit/Training;Patient/family education;Neuromuscular re-education;Manual techniques;Dry needling;Energy conservation;Splinting;Spinal Manipulations;Joint Manipulations    PT Next Visit Plan continue hip/LLE  strengthening with emphasis on glutes, continue core strengthening;    PT Home Exercise Plan 10/13 hip ext, squat, pigeon str, piriformis str, SKTC 10/21 forward step up, lateral step up, marching, dead bug iso    Consulted and Agree with Plan of Care Patient           Patient will benefit from skilled therapeutic intervention in order to improve the following deficits and impairments:  Pain, Decreased strength, Decreased mobility, Improper body mechanics, Hypomobility, Decreased activity tolerance, Increased muscle spasms, Decreased endurance  Visit Diagnosis: Muscle weakness (generalized)  Other abnormalities of  gait and mobility  Low back pain, unspecified back pain laterality, unspecified chronicity, unspecified whether sciatica present  Other symptoms and signs involving the musculoskeletal system  Cervicalgia     Problem List Patient Active Problem List   Diagnosis Date Noted  . History of colonic polyps 10/30/2019  . Hypothyroidism, adult 05/01/2019  . Vitamin D deficiency disease 05/01/2019  . Essential hypertension, benign 05/01/2019  . 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 Depression 05/01/2019    4:20 PM, 04/02/20 Mearl Latin PT, DPT Physical Therapist at [place], [place], [address] Phone: [phone]   Fax:  [phone]  Name: Crystal Mccarthy MRN: [id number] Date of Birth: 1956-02-17

## 2020-04-06 ENCOUNTER — Ambulatory Visit (HOSPITAL_COMMUNITY): Payer: Medicare HMO

## 2020-04-06 ENCOUNTER — Encounter (HOSPITAL_COMMUNITY): Payer: Self-pay

## 2020-04-06 ENCOUNTER — Other Ambulatory Visit: Payer: Self-pay

## 2020-04-06 DIAGNOSIS — R29898 Other symptoms and signs involving the musculoskeletal system: Secondary | ICD-10-CM

## 2020-04-06 DIAGNOSIS — M545 Low back pain, unspecified: Secondary | ICD-10-CM | POA: Diagnosis not present

## 2020-04-06 DIAGNOSIS — R2689 Other abnormalities of gait and mobility: Secondary | ICD-10-CM | POA: Diagnosis not present

## 2020-04-06 DIAGNOSIS — M542 Cervicalgia: Secondary | ICD-10-CM

## 2020-04-06 DIAGNOSIS — M6281 Muscle weakness (generalized): Secondary | ICD-10-CM | POA: Diagnosis not present

## 2020-04-06 NOTE — Therapy (Signed)
[place], [place], [address] Phone: [phone]   Fax:  [phone]  Physical Therapy Treatment  Patient Details  Name: Crystal Mccarthy MRN: [id number] Date of Birth: Aug 31, 1955 Referring Provider (PT): Duffy Rhody MD   Encounter Date: 04/06/2020   PT End of Session - 04/06/20 1157    Visit Number 4    Number of Visits 12    Date for PT Re-Evaluation 04/29/20    Authorization Type A[employer]na Medicare (no auth, no visit limit)    Progress Note Due on Visit 10    PT Start Time 1128    PT Stop Time 1212    PT Time Calculation (min) 44 min    Activity Tolerance Patient tolerated treatment well    Behavior During Therapy [facility] for tasks assessed/performed           Past Medical History:  Diagnosis Date  . Depression 05/01/2019  . Essential hypertension, benign Yes  . Hypothyroidism, adult 05/01/2019  . 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 Vitamin D deficiency disease 05/01/2019    Past Surgical History:  Procedure Laterality Date  . BREAST BIOPSY Right 06/2019   negative  . cataract surgery    . COLONOSCOPY  2016   [place]; per patient, 1 polyp with recommendations to repeat in 5 years  . COLONOSCOPY  2010   [place], per patient, normal exam  . COLONOSCOPY WITH PROPOFOL N/A 02/10/2020   Procedure: COLONOSCOPY WITH PROPOFOL;  Surgeon: Daneil Dolin, MD;  [facility];  Service: Endoscopy;  Laterality: N/A;  1:30pm  . POLYPECTOMY  02/10/2020   Procedure: POLYPECTOMY;  Surgeon: Daneil Dolin, MD;  [facility];  Service: Endoscopy;;    There were no vitals filed for this visit.   Subjective Assessment - 04/06/20 1134    Subjective Pt stated she has began to have pain at night following newest HEP.  Pain scale 2/10 lateral gluteal mm.  Continues to have difficulty wiht climbing stairs with Lt foot leading.    Pertinent History low back pain    Patient Stated Goals  decrease pain    Currently in Pain? Yes    Pain Score 2     Pain Location Hip    Pain Orientation Left    Pain Descriptors / Indicators Sore;Aching    Pain Onset More than a month ago    Pain Frequency Intermittent    Effect of Pain on Daily Activities Limits                             [facility] Adult PT Treatment/Exercise - 04/06/20 0001      Exercises   Exercises Knee/Hip      Knee/Hip Exercises: Stretches   Active Hamstring Stretch 2 reps;30 seconds    Active Hamstring Stretch Limitations supine    Piriformis Stretch 3 reps;30 seconds    Piriformis Stretch Limitations figure 4    Other Knee/Hip Stretches SKTC 5 x 10 second holds    Other Knee/Hip Stretches pigeon stretch 3x30 second hold      Knee/Hip Exercises: Standing   Lateral Step Up 10 reps;Both;Hand Hold: 1;Step Height: 6"    Forward Step Up Both;10 reps;Hand Hold: 1;Hand Hold: 0;Step Height: 6"    Functional Squat 10 reps    Functional Squat Limitations heel raise then squat    Other Standing Knee Exercises side step 1RT  Other Standing Knee Exercises sidestep minisquat 1RT      Knee/Hip Exercises: Supine   Bridges 10 reps    Bridges Limitations 5 second holds                    PT Short Term Goals - 03/18/20 1616      PT SHORT TERM GOAL #1   Title Patient will be independent with HEP in order to improve functional outcomes.    Time 3    Period Weeks    Status New    Target Date 04/08/20      PT SHORT TERM GOAL #2   Title Patient will report at least 25% improvement in symptoms for improved quality of life.    Time 3    Period Weeks    Status New    Target Date 04/08/20             PT Long Term Goals - 03/18/20 1616      PT LONG TERM GOAL #1   Title Patient will report at least 75% improvement in symptoms for improved quality of life.    Time 6    Period Weeks    Status New    Target Date 04/29/20      PT LONG TERM GOAL #2   Title Patient will be able to  navigate stairs with reciprocal pattern without compensation or pain in order to demonstrate improved LE strength.    Time 6    Period Weeks    Status New    Target Date 04/29/20      PT LONG TERM GOAL #3   Title Patient will be able to stand for at least 20 minutes with symptoms no greater than 2/10 for improved tolerance to standing.    Time 6    Period Weeks    Status New    Target Date 04/29/20                 Plan - 04/06/20 1222    Clinical Impression Statement Reviewed current HEP and encouraged pt to begin hip stretches prior rest at night at assist with increased soreness.  Session focus on gluteal strengthening and stretches to address soreness.  Able to progress functional strengthening with increased step up height and additional minisquats sidestepping.  Pt continues to have soreness through session with pain scale 2/10 at EOS.    Personal Factors and Comorbidities Age;Comorbidity 2;Past/Current Experience;Social Background;Time since onset of injury/illness/exacerbation    Comorbidities htn, depression    Examination-Activity Limitations Stairs;Stand;Sleep    Examination-Participation Restrictions Cleaning;Shop    Stability/Clinical Decision Making Evolving/Moderate complexity    Clinical Decision Making Moderate    Rehab Potential Good    PT Frequency 2x / week    PT Duration 6 weeks    PT Treatment/Interventions ADLs/Self Care Home Management;Aquatic Therapy;Cryotherapy;Electrical Stimulation;Iontophoresis 4mg /ml Dexamethasone;Moist Heat;Traction;DME Instruction;Ultrasound;Gait training;Stair training;Functional mobility training;Therapeutic activities;Therapeutic exercise;Balance training;Orthotic Fit/Training;Patient/family education;Neuromuscular re-education;Manual techniques;Dry needling;Energy conservation;Splinting;Spinal Manipulations;Joint Manipulations    PT Next Visit Plan continue hip/LLE strengthening with emphasis on glutes, continue core strengthening;     PT Home Exercise Plan 10/13 hip ext, squat, pigeon str, piriformis str, SKTC 10/21 forward step up, lateral step up, marching, dead bug iso           Patient will benefit from skilled therapeutic intervention in order to improve the following deficits and impairments:  Pain, Decreased strength, Decreased mobility, Improper body mechanics, Hypomobility, Decreased activity tolerance, Increased muscle spasms, Decreased endurance  Visit Diagnosis: Muscle weakness (generalized)  Other abnormalities of gait and mobility  Low back pain, unspecified back pain laterality, unspecified chronicity, unspecified whether sciatica present  Other symptoms and signs involving the musculoskeletal system  Cervicalgia     Problem List Patient Active Problem List   Diagnosis Date Noted  . History of colonic polyps 10/30/2019  . Hypothyroidism, adult 05/01/2019  . Vitamin D deficiency disease 05/01/2019  . Essential hypertension, benign 05/01/2019  . 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 Depression 05/01/2019   Ihor Austin, LPTA/CLT; CBIS [phone]  Aldona Lento 04/06/2020, 12:27 PM  [place] [address] [place], [place], [address] Phone: [phone]   Fax:  [phone]  Name: Crystal Mccarthy MRN: [id number] Date of Birth: [birth year]

## 2020-04-06 NOTE — Patient Instructions (Signed)
Bridge    Lie back, legs bent. Inhale, pressing hips up. Keeping ribs in, lengthen lower back. Exhale, rolling down along spine from top. Repeat 10 times. Do 2 sessions per day.  http://pm.exer.us/55   Copyright  VHI. All rights reserved.   Piriformis Stretch, Supine    Lie supine, one ankle crossed onto opposite knee. Holding bottom leg behind knee, gently pull legs toward chest until stretch is felt in buttock of top leg.  Hold 30 seconds. For deeper stretch gently push top knee away from body.  Repeat 3 times per session. Do 30 sessions per day.  Copyright  VHI. All rights reserved.

## 2020-04-08 ENCOUNTER — Ambulatory Visit (HOSPITAL_COMMUNITY): Payer: Medicare HMO | Admitting: Physical Therapy

## 2020-04-08 ENCOUNTER — Ambulatory Visit (INDEPENDENT_AMBULATORY_CARE_PROVIDER_SITE_OTHER): Payer: Medicare HMO | Admitting: Internal Medicine

## 2020-04-08 ENCOUNTER — Other Ambulatory Visit: Payer: Self-pay

## 2020-04-08 ENCOUNTER — Encounter (HOSPITAL_COMMUNITY): Payer: Self-pay | Admitting: Physical Therapy

## 2020-04-08 DIAGNOSIS — R2689 Other abnormalities of gait and mobility: Secondary | ICD-10-CM

## 2020-04-08 DIAGNOSIS — R29898 Other symptoms and signs involving the musculoskeletal system: Secondary | ICD-10-CM

## 2020-04-08 DIAGNOSIS — M545 Low back pain, unspecified: Secondary | ICD-10-CM

## 2020-04-08 DIAGNOSIS — M6281 Muscle weakness (generalized): Secondary | ICD-10-CM | POA: Diagnosis not present

## 2020-04-08 DIAGNOSIS — M542 Cervicalgia: Secondary | ICD-10-CM

## 2020-04-08 NOTE — Therapy (Signed)
[place], [place], [address] Phone: [phone]   Fax:  [phone]  Physical Therapy Treatment  Patient Details  Name: Crystal Mccarthy MRN: [id number] Date of Birth: [birth year] Referring Provider (PT): Duffy Rhody MD   Encounter Date: 04/08/2020   PT End of Session - 04/08/20 1131    Visit Number 5    Number of Visits 12    Date for PT Re-Evaluation 04/29/20    Authorization Type [employer] Medicare (no auth, no visit limit)    Progress Note Due on Visit 10    PT Start Time 1133    PT Stop Time 1215    PT Time Calculation (min) 42 min    Activity Tolerance Patient tolerated treatment well    Behavior During Therapy [facility] for tasks assessed/performed           Past Medical History:  Diagnosis Date   Depression 05/01/2019   Essential hypertension, benign Yes   Hypothyroidism, adult 05/01/2019   Primary ovarian failure 05/01/2019   Vitamin D deficiency disease 05/01/2019    Past Surgical History:  Procedure Laterality Date   BREAST BIOPSY Right 06/2019   negative   cataract surgery     COLONOSCOPY  2016   [place]; per patient, 1 polyp with recommendations to repeat in 5 years   COLONOSCOPY  2010   [place], per patient, normal exam   COLONOSCOPY WITH PROPOFOL N/A 02/10/2020   Procedure: COLONOSCOPY WITH PROPOFOL;  Surgeon: Daneil Dolin, MD;  [facility];  Service: Endoscopy;  Laterality: N/A;  1:30pm   POLYPECTOMY  02/10/2020   Procedure: POLYPECTOMY;  Surgeon: Daneil Dolin, MD;  [facility];  Service: Endoscopy;;    There were no vitals filed for this visit.   Subjective Assessment - 04/08/20 1133    Subjective Patient states she is sore today. She was sore in her hip and has the pain in her leg the last couple days. She has been doing exercises every day. She is wondering if she should come in less often.    Pertinent History low back pain     Patient Stated Goals decrease pain    Currently in Pain? Yes    Pain Score 2     Pain Location Hip    Pain Orientation Left    Pain Descriptors / Indicators Sore;Aching    Pain Onset More than a month ago              [facility] PT Assessment - 04/08/20 0001      Palpation   Palpation comment TTP L glutes, piriformis, greater trochanter                         [facility] Adult PT Treatment/Exercise - 04/08/20 0001      Knee/Hip Exercises: Prone   Hip Extension Both;2 sets;10 reps                  PT Education - 04/08/20 1133    Education Details Patient educated on HEP, exercise mechanics, review of HEP, referral patterns, hip anatomy, exercise progressions, yoga, strengthening hip, soreness    Person(s) Educated Patient    Methods Explanation;Demonstration    Comprehension Verbalized understanding;Returned demonstration            PT Short Term Goals - 03/18/20 1616      PT SHORT TERM GOAL #1   Title Patient will be  independent with HEP in order to improve functional outcomes.    Time 3    Period Weeks    Status New    Target Date 04/08/20      PT SHORT TERM GOAL #2   Title Patient will report at least 25% improvement in symptoms for improved quality of life.    Time 3    Period Weeks    Status New    Target Date 04/08/20             PT Long Term Goals - 03/18/20 1616      PT LONG TERM GOAL #1   Title Patient will report at least 75% improvement in symptoms for improved quality of life.    Time 6    Period Weeks    Status New    Target Date 04/29/20      PT LONG TERM GOAL #2   Title Patient will be able to navigate stairs with reciprocal pattern without compensation or pain in order to demonstrate improved LE strength.    Time 6    Period Weeks    Status New    Target Date 04/29/20      PT LONG TERM GOAL #3   Title Patient will be able to stand for at least 20 minutes with symptoms no greater than 2/10 for improved tolerance to  standing.    Time 6    Period Weeks    Status New    Target Date 04/29/20                 Plan - 04/08/20 1132    Clinical Impression Statement Discussed POC with patient today and recent increase in symptoms. Patient able to complete hip extension exercise with min verbal cueing for avoiding lumbar compensation with good carry over. Patient continues to remain tender to palpation throughout L glutes, piriformis, and at greater trochanter. Reviewed HEP with patient today. Discussed and educated on hip anatomy, referral patterns of pain, importance of glute strength, probable soreness, and continuing yoga. Patient will complete one session of PT next week to assess response to HEP and yoga routine. Patient will continue to benefit from skilled physical therapy in order to reduce impairment and improve function.    Personal Factors and Comorbidities Age;Comorbidity 2;Past/Current Experience;Social Background;Time since onset of injury/illness/exacerbation    Comorbidities htn, depression    Examination-Activity Limitations Stairs;Stand;Sleep    Examination-Participation Restrictions Cleaning;Shop    Stability/Clinical Decision Making Evolving/Moderate complexity    Rehab Potential Good    PT Frequency 2x / week    PT Duration 6 weeks    PT Treatment/Interventions ADLs/Self Care Home Management;Aquatic Therapy;Cryotherapy;Electrical Stimulation;Iontophoresis 4mg /ml Dexamethasone;Moist Heat;Traction;DME Instruction;Ultrasound;Gait training;Stair training;Functional mobility training;Therapeutic activities;Therapeutic exercise;Balance training;Orthotic Fit/Training;Patient/family education;Neuromuscular re-education;Manual techniques;Dry needling;Energy conservation;Splinting;Spinal Manipulations;Joint Manipulations    PT Next Visit Plan continue hip/LLE strengthening with emphasis on glutes, continue core strengthening;    PT Home Exercise Plan 10/13 hip ext, squat, pigeon str, piriformis str,  SKTC 10/21 forward step up, lateral step up, marching, dead bug iso           Patient will benefit from skilled therapeutic intervention in order to improve the following deficits and impairments:  Pain, Decreased strength, Decreased mobility, Improper body mechanics, Hypomobility, Decreased activity tolerance, Increased muscle spasms, Decreased endurance  Visit Diagnosis: Muscle weakness (generalized)  Other abnormalities of gait and mobility  Low back pain, unspecified back pain laterality, unspecified chronicity, unspecified whether sciatica present  Other symptoms and signs involving the  musculoskeletal system  Cervicalgia     Problem List Patient Active Problem List   Diagnosis Date Noted   History of colonic polyps 10/30/2019   Hypothyroidism, adult 05/01/2019   Vitamin D deficiency disease 05/01/2019   Essential hypertension, benign 05/01/2019   Primary ovarian failure 05/01/2019   Depression 05/01/2019   12:30 PM, 04/08/20 Mearl Latin PT, DPT Physical Therapist at [place], [place], [address] Phone: [phone]   Fax:  [phone]  Name: Crystal Mccarthy MRN: [id number] Date of Birth: 1955/08/30

## 2020-04-13 ENCOUNTER — Ambulatory Visit (HOSPITAL_COMMUNITY): Payer: Medicare HMO | Admitting: Physical Therapy

## 2020-04-15 ENCOUNTER — Encounter (HOSPITAL_COMMUNITY): Payer: Self-pay | Admitting: Physical Therapy

## 2020-04-15 ENCOUNTER — Ambulatory Visit (HOSPITAL_COMMUNITY): Payer: Medicare HMO | Attending: Neurosurgery | Admitting: Physical Therapy

## 2020-04-15 ENCOUNTER — Other Ambulatory Visit: Payer: Self-pay

## 2020-04-15 DIAGNOSIS — M6281 Muscle weakness (generalized): Secondary | ICD-10-CM | POA: Insufficient documentation

## 2020-04-15 DIAGNOSIS — R29898 Other symptoms and signs involving the musculoskeletal system: Secondary | ICD-10-CM | POA: Diagnosis not present

## 2020-04-15 DIAGNOSIS — M545 Low back pain, unspecified: Secondary | ICD-10-CM | POA: Diagnosis not present

## 2020-04-15 DIAGNOSIS — M542 Cervicalgia: Secondary | ICD-10-CM | POA: Insufficient documentation

## 2020-04-15 DIAGNOSIS — R2689 Other abnormalities of gait and mobility: Secondary | ICD-10-CM | POA: Diagnosis not present

## 2020-04-15 NOTE — Patient Instructions (Signed)
Access Code: [email or web address] URL: [email or web address] Date: 04/15/2020 Prepared by: Margie Billet  Exercises Seated Thoracic Lumbar Extension with Pectoralis Stretch - 1 x daily - 7 x weekly - 10 reps - 5 second hold Shoulder External Rotation and Scapular Retraction with Resistance - 1 x daily - 7 x weekly - 2 sets - 10 reps Lateral Step Down - 1 x daily - 7 x weekly - 1 sets - 10 reps Quadruped Hip Abduction and External Rotation - 1 x daily - 7 x weekly - 1 sets - 10 reps

## 2020-04-15 NOTE — Therapy (Signed)
[place], [place], [address] Phone: [phone]   Fax:  [phone]  Physical Therapy Treatment  Patient Details  Name: Crystal Mccarthy MRN: [id number] Date of Birth: 1956/05/06 Referring Provider (PT): Duffy Rhody MD   Encounter Date: 04/15/2020   PT End of Session - 04/15/20 1132    Visit Number 6    Number of Visits 12    Date for PT Re-Evaluation 04/29/20    Authorization Type [employer]na Medicare (no auth, no visit limit)    Progress Note Due on Visit 10    PT Start Time 1133    PT Stop Time 1215    PT Time Calculation (min) 42 min    Activity Tolerance Patient tolerated treatment well    Behavior During Therapy [facility] for tasks assessed/performed           Past Medical History:  Diagnosis Date  . Depression 05/01/2019  . Essential hypertension, benign Yes  . Hypothyroidism, adult 05/01/2019  . 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 Vitamin D deficiency disease 05/01/2019    Past Surgical History:  Procedure Laterality Date  . BREAST BIOPSY Right 06/2019   negative  . cataract surgery    . COLONOSCOPY  2016   [place]; per patient, 1 polyp with recommendations to repeat in 5 years  . COLONOSCOPY  2010   [place], per patient, normal exam  . COLONOSCOPY WITH PROPOFOL N/A 02/10/2020   Procedure: COLONOSCOPY WITH PROPOFOL;  Surgeon: Daneil Dolin, MD;  [facility];  Service: Endoscopy;  Laterality: N/A;  1:30pm  . POLYPECTOMY  02/10/2020   Procedure: POLYPECTOMY;  Surgeon: Daneil Dolin, MD;  [facility];  Service: Endoscopy;;    There were no vitals filed for this visit.   Subjective Assessment - 04/15/20 1132    Subjective Patient states she feels that it is getting better. She is aching today which she thinks is from stairs. She has been able to do stairs more at home. [place] has been better. She is currently having hip aching and aching  into her knee. She continues to have mid/upper back pain with activities at home.    Pertinent History low back pain    Patient Stated Goals decrease pain    Currently in Pain? Yes    Pain Score 2     Pain Location Hip    Pain Orientation Left    Pain Descriptors / Indicators Sore    Pain Onset More than a month ago                             [facility] Adult PT Treatment/Exercise - 04/15/20 0001      Knee/Hip Exercises: Stretches   Other Knee/Hip Stretches t/sp extension over chair 10 x 5 second holds      Knee/Hip Exercises: Standing   Lateral Step Up Left;1 set;Hand Hold: 1;Step Height: 4"    Other Standing Knee Exercises scapular retraction with glenohumeral ER with red band at wall 1x 10 with red band      Knee/Hip Exercises: Prone   Other Prone Exercises quadruped hip extension1x10 bilateral                   PT Education - 04/15/20 1131    Education Details Patient educated on HEP, exercise mechanics    Person(s) Educated Patient    Methods Explanation;Demonstration  Comprehension Verbalized understanding;Returned demonstration            PT Short Term Goals - 03/18/20 1616      PT SHORT TERM GOAL #1   Title Patient will be independent with HEP in order to improve functional outcomes.    Time 3    Period Weeks    Status New    Target Date 04/08/20      PT SHORT TERM GOAL #2   Title Patient will report at least 25% improvement in symptoms for improved quality of life.    Time 3    Period Weeks    Status New    Target Date 04/08/20             PT Long Term Goals - 03/18/20 1616      PT LONG TERM GOAL #1   Title Patient will report at least 75% improvement in symptoms for improved quality of life.    Time 6    Period Weeks    Status New    Target Date 04/29/20      PT LONG TERM GOAL #2   Title Patient will be able to navigate stairs with reciprocal pattern without compensation or pain in order to demonstrate improved LE  strength.    Time 6    Period Weeks    Status New    Target Date 04/29/20      PT LONG TERM GOAL #3   Title Patient will be able to stand for at least 20 minutes with symptoms no greater than 2/10 for improved tolerance to standing.    Time 6    Period Weeks    Status New    Target Date 04/29/20                 Plan - 2020-04-25 1132    Clinical Impression Statement Patient continues to have thoracic spine symptom which may be related to mobility and muscular weakness. Patient completes thoracic extension exercise with c/o discomfort with returning to neutral. Had to adjust back position by using blue mat/towel/ step under feet for focus on thoracic mobility. Patient tolerates scapular retraction with glenohumeral ER well and performs at wall for postural cueing. She states ache throughout upper back/cervical spine following. Patient given cueing for eccentric control with lateral step down exercise today with good carry over. Patient educated on how to complete at home and make larger step smaller for proper challenge. Patient requires verbal cueing for limiting excessive lumbar spine movement with fire hydrant exercise with fair carry over. Patient will continue to benefit from skilled physical therapy in order to reduce impairment and improve function.    Personal Factors and Comorbidities Age;Comorbidity 2;Past/Current Experience;Social Background;Time since onset of injury/illness/exacerbation    Comorbidities htn, depression    Examination-Activity Limitations Stairs;Stand;Sleep    Examination-Participation Restrictions Cleaning;Shop    Stability/Clinical Decision Making Evolving/Moderate complexity    Rehab Potential Good    PT Frequency 2x / week    PT Duration 6 weeks    PT Treatment/Interventions ADLs/Self Care Home Management;Aquatic Therapy;Cryotherapy;Electrical Stimulation;Iontophoresis 4mg /ml Dexamethasone;Moist Heat;Traction;DME Instruction;Ultrasound;Gait training;Stair  training;Functional mobility training;Therapeutic activities;Therapeutic exercise;Balance training;Orthotic Fit/Training;Patient/family education;Neuromuscular re-education;Manual techniques;Dry needling;Energy conservation;Splinting;Spinal Manipulations;Joint Manipulations    PT Next Visit Plan continue hip/LLE strengthening with emphasis on glutes, continue core strengthening;    PT Home Exercise Plan 10/13 hip ext, squat, pigeon str, piriformis str, SKTC 10/21 forward step up, lateral step up, marching, dead bug iso April 26, 2023 t/sp ext over chair, scap ret  with [facility] ER, lateral step down, fire hydrant           Patient will benefit from skilled therapeutic intervention in order to improve the following deficits and impairments:  Pain, Decreased strength, Decreased mobility, Improper body mechanics, Hypomobility, Decreased activity tolerance, Increased muscle spasms, Decreased endurance  Visit Diagnosis: Muscle weakness (generalized)  Other abnormalities of gait and mobility  Low back pain, unspecified back pain laterality, unspecified chronicity, unspecified whether sciatica present  Other symptoms and signs involving the musculoskeletal system  Cervicalgia     Problem List Patient Active Problem List   Diagnosis Date Noted  . History of colonic polyps 10/30/2019  . Hypothyroidism, adult 05/01/2019  . Vitamin D deficiency disease 05/01/2019  . Essential hypertension, benign 05/01/2019  . 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 Depression 05/01/2019    12:25 PM, 04/15/20 Mearl Latin PT, DPT Physical Therapist at [place], [place], [address] Phone: [phone]   Fax:  [phone]  Name: Crystal Mccarthy MRN: [id number] Date of Birth: 06/23/1955

## 2020-04-20 ENCOUNTER — Encounter (HOSPITAL_COMMUNITY): Payer: Medicare HMO | Admitting: Physical Therapy

## 2020-04-22 ENCOUNTER — Other Ambulatory Visit: Payer: Self-pay

## 2020-04-22 ENCOUNTER — Encounter (HOSPITAL_COMMUNITY): Payer: Self-pay | Admitting: Physical Therapy

## 2020-04-22 ENCOUNTER — Ambulatory Visit (HOSPITAL_COMMUNITY): Payer: Medicare HMO | Admitting: Physical Therapy

## 2020-04-22 DIAGNOSIS — R29898 Other symptoms and signs involving the musculoskeletal system: Secondary | ICD-10-CM

## 2020-04-22 DIAGNOSIS — M6281 Muscle weakness (generalized): Secondary | ICD-10-CM | POA: Diagnosis not present

## 2020-04-22 DIAGNOSIS — M542 Cervicalgia: Secondary | ICD-10-CM

## 2020-04-22 DIAGNOSIS — R2689 Other abnormalities of gait and mobility: Secondary | ICD-10-CM

## 2020-04-22 DIAGNOSIS — M545 Low back pain, unspecified: Secondary | ICD-10-CM | POA: Diagnosis not present

## 2020-04-22 NOTE — Therapy (Signed)
[facility] [address] [place], [place], [address] Phone: [phone]   Fax:  [phone]  Physical Therapy Treatment  Patient Details  Name: Crystal Mccarthy MRN: [id number] Date of Birth: Oct 12, 1955 Referring Provider (PT): Duffy Rhody MD   Encounter Date: 04/22/2020   PT End of Session - 04/22/20 1358    Visit Number 7    Number of Visits 12    Date for PT Re-Evaluation 04/29/20    Authorization Type Aetna Medicare (no auth, no visit limit)    Progress Note Due on Visit 10    PT Start Time 1400    PT Stop Time 1445    PT Time Calculation (min) 45 min    Activity Tolerance Patient tolerated treatment well    Behavior During Therapy [facility] for tasks assessed/performed           Past Medical History:  Diagnosis Date  . Depression 05/01/2019  . Essential hypertension, benign Yes  . Hypothyroidism, adult 05/01/2019  . 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 Vitamin D deficiency disease 05/01/2019    Past Surgical History:  Procedure Laterality Date  . BREAST BIOPSY Right 06/2019   negative  . cataract surgery    . COLONOSCOPY  2016   [place]; per patient, 1 polyp with recommendations to repeat in 5 years  . COLONOSCOPY  2010   [place], per patient, normal exam  . COLONOSCOPY WITH PROPOFOL N/A 02/10/2020   Procedure: COLONOSCOPY WITH PROPOFOL;  Surgeon: Daneil Dolin, MD;  [facility];  Service: Endoscopy;  Laterality: N/A;  1:30pm  . POLYPECTOMY  02/10/2020   Procedure: POLYPECTOMY;  Surgeon: Daneil Dolin, MD;  [facility];  Service: Endoscopy;;    There were no vitals filed for this visit.   Subjective Assessment - 04/22/20 1359    Subjective Patient states that her hip did really hard time after golfing yesterday. She had ankle pain too yesterday and today which she has not had that in a while. Her exercises are going well. She is not able to do the extension exercises due to  pain with coming out of the positions. She has been doing her yoga.    Pertinent History low back pain    Patient Stated Goals decrease pain    Currently in Pain? Yes    Pain Score 2     Pain Location Hip    Pain Orientation Left    Pain Onset More than a month ago                             [facility] Adult PT Treatment/Exercise - 04/22/20 0001      Knee/Hip Exercises: Stretches   Piriformis Stretch 3 reps;30 seconds    Piriformis Stretch Limitations figure 4, and crossbody    Other Knee/Hip Stretches SKTC 5 x 10 second holds      Knee/Hip Exercises: Standing   Hip Flexion Both;1 set;10 reps    Hip Flexion Limitations with glute activation    Lateral Step Up 10 reps    Lateral Step Up Limitations step up and eccentric step downs    Forward Step Up 10 reps;Step Height: 6";Left    Functional Squat Limitations 2 reps with UE support      Knee/Hip Exercises: Supine   Other Supine Knee/Hip Exercises dead bug isometrics with ball 1x7      Knee/Hip Exercises: Prone   Hip  Extension Both;10 reps;1 set    Other Prone Exercises quadruped hip extension1x10 bilateral     Other Prone Exercises quadruped fire hydrant 10x                   PT Education - 04/22/20 1358    Education Details Patient educated on HEP, exercise mechanics    Person(s) Educated Patient    Methods Explanation;Demonstration    Comprehension Verbalized understanding;Returned demonstration            PT Short Term Goals - 03/18/20 1616      PT SHORT TERM GOAL #1   Title Patient will be independent with HEP in order to improve functional outcomes.    Time 3    Period Weeks    Status New    Target Date 04/08/20      PT SHORT TERM GOAL #2   Title Patient will report at least 25% improvement in symptoms for improved quality of life.    Time 3    Period Weeks    Status New    Target Date 04/08/20             PT Long Term Goals - 03/18/20 1616      PT LONG TERM GOAL #1    Title Patient will report at least 75% improvement in symptoms for improved quality of life.    Time 6    Period Weeks    Status New    Target Date 04/29/20      PT LONG TERM GOAL #2   Title Patient will be able to navigate stairs with reciprocal pattern without compensation or pain in order to demonstrate improved LE strength.    Time 6    Period Weeks    Status New    Target Date 04/29/20      PT LONG TERM GOAL #3   Title Patient will be able to stand for at least 20 minutes with symptoms no greater than 2/10 for improved tolerance to standing.    Time 6    Period Weeks    Status New    Target Date 04/29/20                 Plan - 04/22/20 1359    Clinical Impression Statement Reviewed HEP with patient today which requires min/no verbal cueing for positioning and mechanics. She is given some cueing for positioning modifications. Patient educated on importance of glute activation and shown how to challenge herself further with lateral step down exercise. Discussed POC and will reassess next session with probable recert for a few more sessions. Patient will continue to benefit from skilled physical therapy in order to reduce impairment and improve function.    Personal Factors and Comorbidities Age;Comorbidity 2;Past/Current Experience;Social Background;Time since onset of injury/illness/exacerbation    Comorbidities htn, depression    Examination-Activity Limitations Stairs;Stand;Sleep    Examination-Participation Restrictions Cleaning;Shop    Stability/Clinical Decision Making Evolving/Moderate complexity    Rehab Potential Good    PT Frequency 2x / week    PT Duration 6 weeks    PT Treatment/Interventions ADLs/Self Care Home Management;Aquatic Therapy;Cryotherapy;Electrical Stimulation;Iontophoresis 4mg /ml Dexamethasone;Moist Heat;Traction;DME Instruction;Ultrasound;Gait training;Stair training;Functional mobility training;Therapeutic activities;Therapeutic exercise;Balance  training;Orthotic Fit/Training;Patient/family education;Neuromuscular re-education;Manual techniques;Dry needling;Energy conservation;Splinting;Spinal Manipulations;Joint Manipulations    PT Next Visit Plan continue hip/LLE strengthening with emphasis on glutes, continue core strengthening;    PT Home Exercise Plan 10/13 hip ext, squat, pigeon str, piriformis str, SKTC 10/21 forward step up, lateral step up,  marching, dead bug iso 11/3 t/sp ext over chair, scap ret with [facility], lateral step down, fire hydrant 11/10 quadruped hip extension           Patient will benefit from skilled therapeutic intervention in order to improve the following deficits and impairments:  Pain, Decreased strength, Decreased mobility, Improper body mechanics, Hypomobility, Decreased activity tolerance, Increased muscle spasms, Decreased endurance  Visit Diagnosis: Muscle weakness (generalized)  Other abnormalities of gait and mobility  Low back pain, unspecified back pain laterality, unspecified chronicity, unspecified whether sciatica present  Other symptoms and signs involving the musculoskeletal system  Cervicalgia     Problem List Patient Active Problem List   Diagnosis Date Noted  . History of colonic polyps 10/30/2019  . Hypothyroidism, adult 05/01/2019  . Vitamin D deficiency disease 05/01/2019  . Essential hypertension, benign 05/01/2019  . 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 Depression 05/01/2019    2:50 PM, 04/22/20 Mearl Latin PT, DPT Physical Therapist at [facility] [place], [place], [address] Phone: [phone]   Fax:  [phone]  Name: Mykell Genao MRN: [id number] Date of Birth: 12-08-1955

## 2020-04-22 NOTE — Patient Instructions (Signed)
Access Code: [email or web address] URL: [email or web address] Date: 04/22/2020 Prepared by: Mitzi Hansen Paco Cislo  Exercises Quadruped Hip Extension Kicks - 1 x daily - 7 x weekly - 2 sets - 10 reps

## 2020-04-23 DIAGNOSIS — R69 Illness, unspecified: Secondary | ICD-10-CM | POA: Diagnosis not present

## 2020-04-27 ENCOUNTER — Ambulatory Visit (HOSPITAL_COMMUNITY): Payer: Medicare HMO | Admitting: Physical Therapy

## 2020-04-29 ENCOUNTER — Encounter (HOSPITAL_COMMUNITY): Payer: Self-pay | Admitting: Physical Therapy

## 2020-04-29 ENCOUNTER — Other Ambulatory Visit: Payer: Self-pay

## 2020-04-29 ENCOUNTER — Ambulatory Visit (HOSPITAL_COMMUNITY): Payer: Medicare HMO | Admitting: Physical Therapy

## 2020-04-29 DIAGNOSIS — R2689 Other abnormalities of gait and mobility: Secondary | ICD-10-CM | POA: Diagnosis not present

## 2020-04-29 DIAGNOSIS — M542 Cervicalgia: Secondary | ICD-10-CM | POA: Diagnosis not present

## 2020-04-29 DIAGNOSIS — R29898 Other symptoms and signs involving the musculoskeletal system: Secondary | ICD-10-CM

## 2020-04-29 DIAGNOSIS — M6281 Muscle weakness (generalized): Secondary | ICD-10-CM

## 2020-04-29 DIAGNOSIS — M545 Low back pain, unspecified: Secondary | ICD-10-CM | POA: Diagnosis not present

## 2020-04-29 NOTE — Therapy (Signed)
[place], [place], [address] Phone: [phone]   Fax:  [phone]  Physical Therapy Treatment/Discharge Summary  Patient Details  Name: Crystal Mccarthy MRN: [id number] Date of Birth: 1956-04-03 Referring Provider (PT): Duffy Rhody MD   Encounter Date: 04/29/2020   PHYSICAL THERAPY DISCHARGE SUMMARY  Visits from Start of Care: 8  Current functional level related to goals / functional outcomes: See below   Remaining deficits: See below   Education / Equipment: See below  Plan: Patient agrees to discharge.  Patient goals were partially met. Patient is being discharged due to meeting the stated rehab goals.  ?????        PT End of Session - 04/29/20 1314    Visit Number 8    Number of Visits 12    Date for PT Re-Evaluation 04/29/20    Authorization Type Aetna Medicare (no auth, no visit limit)    Progress Note Due on Visit 10    PT Start Time 1315    PT Stop Time 1340    PT Time Calculation (min) 25 min    Activity Tolerance Patient tolerated treatment well    Behavior During Therapy WFL for tasks assessed/performed           Past Medical History:  Diagnosis Date  . Depression 05/01/2019  . Essential hypertension, benign Yes  . Hypothyroidism, adult 05/01/2019  . 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 Vitamin D deficiency disease 05/01/2019    Past Surgical History:  Procedure Laterality Date  . BREAST BIOPSY Right 06/2019   negative  . cataract surgery    . COLONOSCOPY  2016   [place]; per patient, 1 polyp with recommendations to repeat in 5 years  . COLONOSCOPY  2010   [place], per patient, normal exam  . COLONOSCOPY WITH PROPOFOL N/A 02/10/2020   Procedure: COLONOSCOPY WITH PROPOFOL;  Surgeon: Daneil Dolin, MD;  [facility];  Service: Endoscopy;  Laterality: N/A;  1:30pm  . POLYPECTOMY  02/10/2020   Procedure: POLYPECTOMY;  Surgeon: Daneil Dolin,  MD;  [facility];  Service: Endoscopy;;    There were no vitals filed for this visit.   Subjective Assessment - 04/29/20 1315    Subjective Patient states she over did it shopping the other day. She was able to walk and play golf, and yoga over the last week. She has most discomfort with lateral step down. She has been doing HEP. She is not as stiff/limping in the morning. She still has intermittent symptoms with stairs.    Pertinent History low back pain    Patient Stated Goals decrease pain    Currently in Pain? Yes    Pain Score 1     Pain Location Hip    Pain Orientation Left    Pain Descriptors / Indicators Sore    Pain Onset More than a month ago              [facility] PT Assessment - 04/29/20 0001      Assessment   Medical Diagnosis Lumbar Stenosis    Referring Provider (PT) Duffy Rhody MD    Onset Date/Surgical Date 07/20/19    Prior Therapy Yes back      Precautions   Precautions None      Restrictions   Weight Bearing Restrictions No      Balance Screen   Has the patient fallen in the past 6 months No  Has the patient had a decrease in activity level because of a fear of falling?  No    Is the patient reluctant to leave their home because of a fear of falling?  No      Prior Function   Level of Independence Independent    Leisure golf      Observation/Other Assessments   Observations Ambulates without AD      Sensation   Light Touch Appears Intact      Strength   Right Hip Flexion 4+/5    Right Hip Extension 4+/5    Right Hip ABduction 5/5    Left Hip Flexion 4+/5    Left Hip Extension 4+/5    Left Hip ABduction 4+/5      Ambulation/Gait   Stairs Yes    Stair Management Technique Alternating pattern    Gait Comments slight pain with ascending stairs, no deficits decending or pain                                 PT Education - 04/29/20 1316    Education Details Patient educated on HEP, exercise mechanics,  walking on treadmill, incline on treadmill    Person(s) Educated Patient    Methods Explanation;Demonstration    Comprehension Verbalized understanding;Returned demonstration            PT Short Term Goals - 04/29/20 1325      PT SHORT TERM GOAL #1   Title Patient will be independent with HEP in order to improve functional outcomes.    Time 3    Period Weeks    Status Achieved    Target Date 04/08/20      PT SHORT TERM GOAL #2   Title Patient will report at least 25% improvement in symptoms for improved quality of life.    Time 3    Period Weeks    Status Achieved    Target Date 04/08/20             PT Long Term Goals - 04/29/20 1327      PT LONG TERM GOAL #1   Title Patient will report at least 75% improvement in symptoms for improved quality of life.    Time 6    Period Weeks    Status Not Met      PT LONG TERM GOAL #2   Title Patient will be able to navigate stairs with reciprocal pattern without compensation or pain in order to demonstrate improved LE strength.    Time 6    Period Weeks    Status Not Met      PT LONG TERM GOAL #3   Title Patient will be able to stand for at least 20 minutes with symptoms no greater than 2/10 for improved tolerance to standing.    Time 6    Period Weeks    Status Not Met                 Plan - 04/29/20 1314    Clinical Impression Statement Patient has met 2/2 short term goals and 0/3 long term goals with ability to complete HEP and improvement in symptoms. Remaining goals not met due to continued report of symptoms, symptoms with stairs, and back pain with standing. Patient overall showing improvement in strength since starting therapy but it appears patient is functioning better over the course of physical therapy. She continues to lack eccentric hip  strength but is showing improvement. Patient educated on progressing reps of exercises for improving strength. Patient discharged from physical therapy at this time.     Personal Factors and Comorbidities Age;Comorbidity 2;Past/Current Experience;Social Background;Time since onset of injury/illness/exacerbation    Comorbidities htn, depression    Examination-Activity Limitations Stairs;Stand;Sleep    Examination-Participation Restrictions Cleaning;Shop    Stability/Clinical Decision Making Evolving/Moderate complexity    Rehab Potential Good    PT Frequency 2x / week    PT Duration 6 weeks    PT Treatment/Interventions ADLs/Self Care Home Management;Aquatic Therapy;Cryotherapy;Electrical Stimulation;Iontophoresis 25m/ml Dexamethasone;Moist Heat;Traction;DME Instruction;Ultrasound;Gait training;Stair training;Functional mobility training;Therapeutic activities;Therapeutic exercise;Balance training;Orthotic Fit/Training;Patient/family education;Neuromuscular re-education;Manual techniques;Dry needling;Energy conservation;Splinting;Spinal Manipulations;Joint Manipulations    PT Next Visit Plan n/a    PT Home Exercise Plan 10/13 hip ext, squat, pigeon str, piriformis str, SKTC 10/21 forward step up, lateral step up, marching, dead bug iso [id number]/sp ext over chair, [facility], lateral step down, fire hydrant 11/10 quadruped hip extension           Patient will benefit from skilled therapeutic intervention in order to improve the following deficits and impairments:  Pain, Decreased strength, Decreased mobility, Improper body mechanics, Hypomobility, Decreased activity tolerance, Increased muscle spasms, Decreased endurance  Visit Diagnosis: Muscle weakness (generalized)  Other abnormalities of gait and mobility  Low back pain, unspecified back pain laterality, unspecified chronicity, unspecified whether sciatica present  Other symptoms and signs involving the musculoskeletal system  Cervicalgia     Problem List Patient Active Problem List   Diagnosis Date Noted  . History of colonic polyps 10/30/2019  . Hypothyroidism, adult 05/01/2019  .  Vitamin D deficiency disease 05/01/2019  . Essential hypertension, benign 05/01/2019  . 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 Depression 05/01/2019   1:54 PM, 04/29/20 AMearl LatinPT, DPT [place] [facility] [address]Phone: [phone]  Fax:  [phone] Name: Crystal LupiMRN: [id number] of Birth: 125-Aug-1957

## 2020-05-05 ENCOUNTER — Other Ambulatory Visit (INDEPENDENT_AMBULATORY_CARE_PROVIDER_SITE_OTHER): Payer: Self-pay | Admitting: Internal Medicine

## 2020-05-20 ENCOUNTER — Other Ambulatory Visit: Payer: Self-pay

## 2020-05-20 ENCOUNTER — Encounter (INDEPENDENT_AMBULATORY_CARE_PROVIDER_SITE_OTHER): Payer: Self-pay | Admitting: Internal Medicine

## 2020-05-20 ENCOUNTER — Ambulatory Visit (INDEPENDENT_AMBULATORY_CARE_PROVIDER_SITE_OTHER): Payer: Medicare HMO | Admitting: Internal Medicine

## 2020-05-20 VITALS — BP 128/72 | HR 75 | Temp 97.3°F | Resp 18 | Ht 65.0 in | Wt 158.2 lb

## 2020-05-20 DIAGNOSIS — E039 Hypothyroidism, unspecified: Secondary | ICD-10-CM | POA: Diagnosis not present

## 2020-05-20 DIAGNOSIS — E782 Mixed hyperlipidemia: Secondary | ICD-10-CM | POA: Diagnosis not present

## 2020-05-20 DIAGNOSIS — E2839 Other primary ovarian failure: Secondary | ICD-10-CM | POA: Diagnosis not present

## 2020-05-20 DIAGNOSIS — I10 Essential (primary) hypertension: Secondary | ICD-10-CM | POA: Diagnosis not present

## 2020-05-20 NOTE — Progress Notes (Signed)
Metrics: Intervention Frequency ACO  Documented Smoking Status Yearly  Screened one or more times in 24 months  Cessation Counseling or  Active cessation medication Past 24 months  Past 24 months   Guideline developer: UpToDate (See UpToDate for funding source) Date Released: 2014       Wellness Office Visit  Subjective:  Patient ID: Crystal Mccarthy, female    DOB: 12-08-1955  Age: 64 y.o. MRN: [id number]  CC: This very pleasant lady comes in for follow-up of hypothyroidism, bioidentical hormone therapy, hyperlipidemia and hypertension. HPI She is doing reasonably well.  She had been suffering from back pain and MRI lumbar spine was looking very diseased.  She ended up seeing a neurosurgeon who recommended injections but she did not want to do this so she went to physical therapy.  Physical therapy has helped her significantly and now she is no longer in pain.  She continues to do exercises at home. She continues on estradiol, progesterone and testosterone as before and is tolerating them. Previous estradiol level was at 78, I would prefer more towards the 100 range.  Progesterone level was in a good range. Overall she feels well.  She continues with desiccated NP thyroid at a dose of 150 mg daily and this seems to have given her the energy that she was desiring. She was on statin therapy for hyperlipidemia and we have stopped this a long time ago and her cholesterol numbers have been improving in the last year.  Past Medical History:  Diagnosis Date  . Depression 05/01/2019  . Essential hypertension, benign Yes  . Hypothyroidism, adult 05/01/2019  . 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 Vitamin D deficiency disease 05/01/2019   Past Surgical History:  Procedure Laterality Date  . BREAST BIOPSY Right 06/2019   negative  . cataract surgery    . COLONOSCOPY  2016   [place]; per patient, 1 polyp with recommendations to repeat in 5 years  . COLONOSCOPY  2010   [place],  per patient, normal exam  . COLONOSCOPY WITH PROPOFOL N/A 02/10/2020   Procedure: COLONOSCOPY WITH PROPOFOL;  Surgeon: Daneil Dolin, MD;  [facility];  Service: Endoscopy;  Laterality: N/A;  1:30pm  . POLYPECTOMY  02/10/2020   Procedure: POLYPECTOMY;  Surgeon: Daneil Dolin, MD;  [facility];  Service: Endoscopy;;     Family History  Problem Relation Age of Onset  . Dementia Mother   . Heart disease Father   . Parkinson's disease Father   . Obesity Sister   . Colon cancer Neg Hx     Social History   Social History Narrative   Married for 18 years,lives with husband.Retired.   Social History   Tobacco Use  . Smoking status: Never Smoker  . Smokeless tobacco: Never Used  Substance Use Topics  . Alcohol use: Yes    Comment: occ- special occasions    Current Meds  Medication Sig  . Ascorbic Acid (VITAMIN C) 1000 MG tablet Take 1,000 mg by mouth daily.  Marland Kitchen aspirin EC 81 MG tablet Take 81 mg by mouth at bedtime.   Marland Kitchen buPROPion (WELLBUTRIN XL) 300 MG 24 hr tablet Take 1 tablet (300 mg total) by mouth daily.  . Cholecalciferol (VITAMIN D-3) 125 MCG (5000 UT) TABS Take 5,000 Units by mouth daily.   . Coenzyme Q10 300 MG CAPS Take 300 mg by mouth at bedtime.   Marland Kitchen eletriptan (RELPAX) 40 MG tablet take 1 tablet by mouth as needed for  migraine or headache *may repeat in 2 hours if headache persists or recurs*  . estradiol (ESTRACE) 2 MG tablet TAKE ONE TABLET BY MOUTH ONE TIME DAILY  . ibuprofen (ADVIL) 800 MG tablet Take 1 tablet (800 mg total) by mouth every 8 (eight) hours as needed.  Javier Docker Oil 500 MG CAPS Take 500 mg by mouth daily.  Marland Kitchen LORazepam (ATIVAN) 0.5 MG tablet TAKE 1 TABLET BY MOUTH ONCE DAILY AS NEEDED FOR ANXIETY  . losartan-hydrochlorothiazide (HYZAAR) 100-25 MG tablet Take 1 tablet by mouth once daily  . Magnesium 500 MG TABS Take 500 mg by mouth daily.   . Melatonin 3 MG CAPS Take 3 mg by mouth every evening.  . pantoprazole (PROTONIX) 40  MG tablet Take 40 mg by mouth daily as needed (Heartburn).   . Probiotic Product (ALIGN) 4 MG CAPS Take 4 mg by mouth daily.  . progesterone (PROMETRIUM) 200 MG capsule TAKE TWO CAPSULES BY MOUTH DAILY  . Testosterone 20 % CREA Apply 2.5 mg topically daily.  Marland Kitchen thyroid (NP THYROID) 120 MG tablet Take 120 mg by mouth daily before breakfast. Take with 30 mg for a total of 150 mg  . thyroid (NP THYROID) 30 MG tablet Take 1 tablet (30 mg total) by mouth daily before breakfast. (Patient taking differently: Take 30 mg by mouth daily before breakfast. Take with 120 mg for a total of 150 mg)  . valACYclovir (VALTREX) 1000 MG tablet Take 1,000 mg by mouth daily as needed (Fever Blisters).   . [DISCONTINUED] predniSONE (DELTASONE) 20 MG tablet Take 2 tablets (40 mg total) by mouth daily with breakfast.  . [DISCONTINUED] PRESCRIPTION MEDICATION Apply 0.25 mLs topically daily. Testosterone 30 gm           Compound at [place] 0.25 ml in the moning      Depression screen [facility] 2/9 09/11/2019  Decreased Interest 0  Down, Depressed, Hopeless 0  PHQ - 2 Score 0     Objective:   Today's Vitals: BP 128/72 (BP Location: Left Arm, Patient Position: Sitting, Cuff Size: Normal)   Pulse 75   Temp (!) 97.3 F (36.3 C) (Temporal)   Resp 18   Ht 5\' 5"  (1.651 m)   Wt 158 lb 3.2 oz (71.8 kg)   SpO2 96%   BMI 26.33 kg/m  Vitals with BMI 05/20/2020 04/01/2020 02/10/2020  Height 5\' 5"  (No Data) -  Weight 158 lbs 3 oz (No Data) -  BMI 86.57 - -  Systolic 846 (No Data) 962  Diastolic 72 (No Data) 85  Pulse 75 - 65     Physical Exam  She look systemically well.  Weight is stable.  Blood pressure is excellent.     Assessment   1. Hypothyroidism, adult   2. Primary ovarian failure   3. Essential hypertension, benign   4. Mixed hyperlipidemia       Tests ordered Orders Placed This Encounter  Procedures  . Lipid panel  . Estradiol  . Progesterone     Plan: 1. She will  continue with NP thyroid 150 mg daily. 2. She will continue with estradiol, progesterone and testosterone therapy with the current doses before.  I will check estradiol and progesterone levels to see if we need to adjust especially the estradiol level. 3. I will check a lipid panel to see how her progress is regarding this. 4. Further recommendations will depend on blood results and I will see her in 3 months time for follow-up  No orders of the defined types were placed in this encounter.   Doree Albee, MD

## 2020-05-21 LAB — LIPID PANEL
Cholesterol: 204 mg/dL — ABNORMAL HIGH (ref <200)
HDL: 52 mg/dL (ref >=50)
LDL Cholesterol (Calc): 135 mg/dL (calc) — ABNORMAL HIGH
Non-HDL Cholesterol (Calc): 152 mg/dL (calc) — ABNORMAL HIGH (ref <130)
Total CHOL/HDL Ratio: 3.9 (calc) (ref <5.0)
Triglycerides: 76 mg/dL (ref <150)

## 2020-05-21 LAB — ESTRADIOL: Estradiol: 79 pg/mL

## 2020-05-21 LAB — PROGESTERONE: Progesterone: 56.4 ng/mL

## 2020-05-22 ENCOUNTER — Encounter (INDEPENDENT_AMBULATORY_CARE_PROVIDER_SITE_OTHER): Payer: Self-pay | Admitting: Internal Medicine

## 2020-06-02 ENCOUNTER — Telehealth (INDEPENDENT_AMBULATORY_CARE_PROVIDER_SITE_OTHER): Payer: Self-pay

## 2020-06-02 ENCOUNTER — Other Ambulatory Visit (INDEPENDENT_AMBULATORY_CARE_PROVIDER_SITE_OTHER): Payer: Self-pay | Admitting: Internal Medicine

## 2020-06-02 MED ORDER — TESTOSTERONE 1.62 % TD GEL: 2.5000 mg | Freq: Every day | TRANSDERMAL | 1 refills | Status: AC

## 2020-06-02 NOTE — Telephone Encounter (Signed)
Received a fax from [place] for a refill request of the following medication:  Testosterone 1% cream Apply 9.40 ml (1 click) to labia daily as directed.  I do not see this on her current medication list. Fax is on my desk if you need to see.

## 2020-06-09 ENCOUNTER — Telehealth (INDEPENDENT_AMBULATORY_CARE_PROVIDER_SITE_OTHER): Payer: Self-pay

## 2020-06-09 NOTE — Telephone Encounter (Signed)
Can you send the following medication to [facility]? Last was sent to [facility].  Testosterone 1.62 % GEL  Filled on 06/02/2020 to [facility] and needs to be sent to [facility].

## 2020-06-09 NOTE — Telephone Encounter (Signed)
I did call this into [place].The pharmacy defaulted to [facility] and I forgot to correct it.She can call [facility] and it should be there-let me know if it is not.Thanks.

## 2020-06-12 ENCOUNTER — Other Ambulatory Visit (INDEPENDENT_AMBULATORY_CARE_PROVIDER_SITE_OTHER): Payer: Self-pay | Admitting: Internal Medicine

## 2020-06-16 ENCOUNTER — Other Ambulatory Visit (INDEPENDENT_AMBULATORY_CARE_PROVIDER_SITE_OTHER): Payer: Self-pay | Admitting: Internal Medicine

## 2020-06-23 ENCOUNTER — Other Ambulatory Visit (INDEPENDENT_AMBULATORY_CARE_PROVIDER_SITE_OTHER): Payer: Self-pay

## 2020-06-23 ENCOUNTER — Telehealth (INDEPENDENT_AMBULATORY_CARE_PROVIDER_SITE_OTHER): Payer: Self-pay

## 2020-06-23 NOTE — Telephone Encounter (Signed)
Do I need to do anything ?

## 2020-06-23 NOTE — Telephone Encounter (Signed)
No, she was confused of what she did last year. She canceled appt until this year. I reintegrated information and she is ok now.

## 2020-06-24 ENCOUNTER — Ambulatory Visit: Payer: Medicare HMO | Admitting: Physician Assistant

## 2020-06-25 ENCOUNTER — Other Ambulatory Visit: Payer: Self-pay

## 2020-06-25 ENCOUNTER — Encounter: Payer: Self-pay | Admitting: Physician Assistant

## 2020-06-25 ENCOUNTER — Ambulatory Visit: Payer: Medicare HMO | Admitting: Physician Assistant

## 2020-06-25 DIAGNOSIS — L821 Other seborrheic keratosis: Secondary | ICD-10-CM

## 2020-06-25 DIAGNOSIS — Z1283 Encounter for screening for malignant neoplasm of skin: Secondary | ICD-10-CM | POA: Diagnosis not present

## 2020-06-25 DIAGNOSIS — D485 Neoplasm of uncertain behavior of skin: Secondary | ICD-10-CM

## 2020-06-25 DIAGNOSIS — L720 Epidermal cyst: Secondary | ICD-10-CM

## 2020-06-25 DIAGNOSIS — D229 Melanocytic nevi, unspecified: Secondary | ICD-10-CM

## 2020-06-25 DIAGNOSIS — D225 Melanocytic nevi of trunk: Secondary | ICD-10-CM | POA: Diagnosis not present

## 2020-06-25 DIAGNOSIS — Z85828 Personal history of other malignant neoplasm of skin: Secondary | ICD-10-CM | POA: Diagnosis not present

## 2020-06-25 HISTORY — DX: Melanocytic nevi, unspecified: D22.9

## 2020-06-25 NOTE — Patient Instructions (Signed)

## 2020-06-26 ENCOUNTER — Encounter: Payer: Self-pay | Admitting: Physician Assistant

## 2020-06-26 NOTE — Progress Notes (Signed)
   New Patient   Subjective  Noel Henandez is a 65 y.o. female who presents for the following: Annual Exam (Back- itchy areas- other than that no new cocnerns).   The following portions of the chart were reviewed this encounter and updated as appropriate:  Tobacco  Allergies  Meds  Problems  Med Hx  Surg Hx  Fam Hx      Objective  Well appearing patient in no apparent distress; mood and affect are within normal limits.  A full examination was performed including scalp, head, eyes, ears, nose, lips, neck, chest, axillae, abdomen, back, buttocks, bilateral upper extremities, bilateral lower extremities, hands, feet, fingers, toes, fingernails, and toenails. All findings within normal limits unless otherwise noted below.  Objective  Mid Tip of Nose: White scar- clear  Objective  Right Breast: Full body skin examination- no atypical moles or non mole skin cancer  Objective  Mid Back: Solitary, smooth skin colored to translucent papule.   Objective  Left Inframammary Fold, Left Lower Back, Right Inframammary Fold: Multiple stuck-on, waxy, tan-brown papules and plaques. --Discussed benign etiology and prognosis.   Objective  Right Buttock: Dark macule      Assessment & Plan  History of basal cell cancer Mid Tip of Nose  Yearly skin check  Encounter for screening for malignant neoplasm of skin Right Breast  Epidermal cyst Mid Back  Okay to leave if not bothersome  Seborrheic keratosis (3) Left Inframammary Fold; Right Inframammary Fold; Left Lower Back  Okay to leave if stable  Neoplasm of uncertain behavior of skin Right Buttock  Skin / nail biopsy Type of biopsy: tangential   Informed consent: discussed and consent obtained   Timeout: patient name, date of birth, surgical site, and procedure verified   Procedure prep:  Patient was prepped and draped in usual sterile fashion (Non sterile) Prep type:  Chlorhexidine Anesthesia: the lesion was  anesthetized in a standard fashion   Anesthetic:  1% lidocaine w/ epinephrine 1-100,000 local infiltration Instrument used: flexible razor blade   Outcome: patient tolerated procedure well   Post-procedure details: wound care instructions given   Additional details:  Cautery only  Specimen 1 - Surgical pathology Differential Diagnosis: r/o atypia  Check Margins: No     I, Anissa Abbs, PA-C, have reviewed all documentation for this visit. The documentation on 06/26/20 for the exam, diagnosis, procedures, and orders are all accurate and complete.Marland Kitchen

## 2020-06-29 ENCOUNTER — Encounter (INDEPENDENT_AMBULATORY_CARE_PROVIDER_SITE_OTHER): Payer: Self-pay | Admitting: Internal Medicine

## 2020-06-30 ENCOUNTER — Encounter: Payer: Self-pay | Admitting: Physician Assistant

## 2020-07-03 ENCOUNTER — Telehealth: Payer: Self-pay | Admitting: Physician Assistant

## 2020-07-03 ENCOUNTER — Encounter: Payer: Self-pay | Admitting: Physician Assistant

## 2020-07-03 NOTE — Telephone Encounter (Signed)
Got message to call for results LINE 2

## 2020-07-03 NOTE — Telephone Encounter (Signed)
Pathology results given to patient.  

## 2020-08-07 ENCOUNTER — Other Ambulatory Visit (INDEPENDENT_AMBULATORY_CARE_PROVIDER_SITE_OTHER): Payer: Self-pay | Admitting: Internal Medicine

## 2020-08-14 ENCOUNTER — Other Ambulatory Visit (INDEPENDENT_AMBULATORY_CARE_PROVIDER_SITE_OTHER): Payer: Self-pay | Admitting: Internal Medicine

## 2020-08-19 ENCOUNTER — Encounter (INDEPENDENT_AMBULATORY_CARE_PROVIDER_SITE_OTHER): Payer: Self-pay | Admitting: Internal Medicine

## 2020-08-19 ENCOUNTER — Ambulatory Visit (INDEPENDENT_AMBULATORY_CARE_PROVIDER_SITE_OTHER): Payer: Medicare HMO | Admitting: Internal Medicine

## 2020-08-19 ENCOUNTER — Other Ambulatory Visit: Payer: Self-pay

## 2020-08-19 VITALS — BP 124/80 | HR 78 | Temp 97.2°F | Resp 18 | Ht 65.0 in | Wt 164.0 lb

## 2020-08-19 DIAGNOSIS — I10 Essential (primary) hypertension: Secondary | ICD-10-CM

## 2020-08-19 DIAGNOSIS — E2839 Other primary ovarian failure: Secondary | ICD-10-CM

## 2020-08-19 DIAGNOSIS — E039 Hypothyroidism, unspecified: Secondary | ICD-10-CM | POA: Diagnosis not present

## 2020-08-19 MED ORDER — ESTRADIOL 0.5 MG PO TABS: 0.5000 mg | ORAL_TABLET | Freq: Every day | ORAL | 1 refills | Status: DC

## 2020-08-19 MED ORDER — ESTRADIOL 2 MG PO TABS
2.0000 mg | ORAL_TABLET | Freq: Every day | ORAL | 0 refills | Status: DC
Start: 2020-08-19 — End: 2020-12-07

## 2020-08-19 NOTE — Progress Notes (Signed)
Metrics: Intervention Frequency ACO  Documented Smoking Status Yearly  Screened one or more times in 24 months  Cessation Counseling or  Active cessation medication Past 24 months  Past 24 months   Guideline developer: UpToDate (See UpToDate for funding source) Date Released: 2014       Wellness Office Visit  Subjective:  Patient ID: Crystal Mccarthy, female    DOB: 03/28/1956  Age: 65 y.o. MRN: [id number]  CC: This lady comes in for follow-up of hypertension, hypothyroidism and identical hormone therapy and menopausal symptoms. HPI  She continues on NP thyroid total dose of 150 mg daily.  Unfortunately estradiol 3 mg daily dose was too much for her and she started to get breast tenderness.  Therefore she is not taking it every day and takes 3 mg every third day of estradiol. She continues on Hyzaar for hypertension. Past Medical History:  Diagnosis Date  . Atypical mole 06/25/2020   Right Buttock (moderate)  . Basal cell carcinoma 1995   nose ( [place])  . Depression 05/01/2019  . Essential hypertension, benign Yes  . Hypothyroidism, adult 05/01/2019  . 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 Vitamin D deficiency disease 05/01/2019   Past Surgical History:  Procedure Laterality Date  . BREAST BIOPSY Right 06/2019   negative  . cataract surgery    . COLONOSCOPY  2016   [place]; per patient, 1 polyp with recommendations to repeat in 5 years  . COLONOSCOPY  2010   [place], per patient, normal exam  . COLONOSCOPY WITH PROPOFOL N/A 02/10/2020   Procedure: COLONOSCOPY WITH PROPOFOL;  Surgeon: Daneil Dolin, MD;  [facility];  Service: Endoscopy;  Laterality: N/A;  1:30pm  . POLYPECTOMY  02/10/2020   Procedure: POLYPECTOMY;  Surgeon: Daneil Dolin, MD;  [facility];  Service: Endoscopy;;     Family History  Problem Relation Age of Onset  . Dementia Mother   . Heart disease Father   . Parkinson's disease Father   . Obesity Sister   .  Colon cancer Neg Hx     Social History   Social History Narrative   Married for 59 years,lives with husband.Retired.   Social History   Tobacco Use  . Smoking status: Never Smoker  . Smokeless tobacco: Never Used  Substance Use Topics  . Alcohol use: Yes    Comment: occ- special occasions    Current Meds  Medication Sig  . Ascorbic Acid (VITAMIN C) 1000 MG tablet Take 1,000 mg by mouth daily.  Marland Kitchen aspirin EC 81 MG tablet Take 81 mg by mouth at bedtime.   Marland Kitchen buPROPion (WELLBUTRIN XL) 300 MG 24 hr tablet TAKE ONE TABLET BY MOUTH ONE TIME DAILY  . Cholecalciferol (VITAMIN D-3) 125 MCG (5000 UT) TABS Take 5,000 Units by mouth daily.   . Coenzyme Q10 300 MG CAPS Take 300 mg by mouth at bedtime.   Marland Kitchen eletriptan (RELPAX) 40 MG tablet take one tablet by mouth as needed for migraine headache *may repeat in 2 hours if headache persists or recurs*  . estradiol (ESTRACE) 0.5 MG tablet Take 1 tablet (0.5 mg total) by mouth daily.  Marland Kitchen estradiol (ESTRACE) 2 MG tablet Take 1 tablet (2 mg total) by mouth daily.  Marland Kitchen ibuprofen (ADVIL) 800 MG tablet Take 1 tablet (800 mg total) by mouth every 8 (eight) hours as needed.  Javier Docker Oil 500 MG CAPS Take 500 mg by mouth daily.  Marland Kitchen LORazepam (ATIVAN) 0.5 MG tablet TAKE 1  TABLET BY MOUTH ONCE DAILY AS NEEDED FOR ANXIETY  . losartan-hydrochlorothiazide (HYZAAR) 100-25 MG tablet Take 1 tablet by mouth once daily  . Magnesium 500 MG TABS Take 500 mg by mouth daily.   . Melatonin 3 MG CAPS Take 3 mg by mouth every evening.  . NP THYROID 120 MG tablet Take 120 mg by mouth daily before breakfast.  . pantoprazole (PROTONIX) 40 MG tablet Take 40 mg by mouth daily as needed (Heartburn).   . Probiotic Product (ALIGN) 4 MG CAPS Take 4 mg by mouth daily.  . progesterone (PROMETRIUM) 200 MG capsule TAKE TWO CAPSULES BY MOUTH DAILY  . Testosterone 1.62 % GEL Place 2.5 mg onto the skin daily. Testosterone 1% cream, apply 1 click (2.5 mg) daily to the labia.  Marland Kitchen thyroid (NP  THYROID) 30 MG tablet Take 1 tablet (30 mg total) by mouth daily before breakfast. (Patient taking differently: Take 30 mg by mouth daily before breakfast. Take with 120 mg for a total of 150 mg)  . valACYclovir (VALTREX) 1000 MG tablet Take 1,000 mg by mouth daily as needed (Fever Blisters).   . [DISCONTINUED] estradiol (ESTRACE) 2 MG tablet TAKE ONE TABLET BY MOUTH ONE TIME DAILY  . [DISCONTINUED] thyroid (ARMOUR) 120 MG tablet Take 120 mg by mouth daily before breakfast. Take with 30 mg for a total of 150 mg       Objective:   Today's Vitals: BP 124/80 (BP Location: Left Arm, Patient Position: Sitting, Cuff Size: Normal)   Pulse 78   Temp (!) 97.2 F (36.2 C) (Temporal)   Resp 18   Ht 5\' 5"  (1.651 m)   Wt 164 lb (74.4 kg)   SpO2 96%   BMI 27.29 kg/m  Vitals with BMI 08/19/2020 05/20/2020 04/01/2020  Height 5\' 5"  5\' 5"  (No Data)  Weight 164 lbs 158 lbs 3 oz (No Data)  BMI 15.17 61.60 -  Systolic 737 106 (No Data)  Diastolic 80 72 (No Data)  Pulse 78 75 -     Physical Exam  She looks systemically well.  She has gained about 6 pounds since last visit.  Blood pressure is in good control.     Assessment   1. Essential hypertension, benign   2. Hypothyroidism, adult   3. Primary ovarian failure       Tests ordered No orders of the defined types were placed in this encounter.    Plan: 1. She will continue with Hyzaar for hypertension which is controlling her blood pressure reasonably well. 2. She will continue with the same dose of NP thyroid 150 mg daily. 3. As far as the estradiol is concerned I think we can go slower and she will take estradiol 2 mg combined with estradiol 0.5 mg every morning.  She will continue with the same dose of progesterone and testosterone. 4. Follow-up in 3 months when we will likely do all the blood work.   Meds ordered this encounter  Medications  . estradiol (ESTRACE) 2 MG tablet    Sig: Take 1 tablet (2 mg total) by mouth daily.     Dispense:  90 tablet    Refill:  0  . estradiol (ESTRACE) 0.5 MG tablet    Sig: Take 1 tablet (0.5 mg total) by mouth daily.    Dispense:  90 tablet    Refill:  1    Sophiagrace [place] Luther Parody, MD

## 2020-08-23 ENCOUNTER — Encounter (INDEPENDENT_AMBULATORY_CARE_PROVIDER_SITE_OTHER): Payer: Self-pay | Admitting: Internal Medicine

## 2020-08-24 DIAGNOSIS — Z1231 Encounter for screening mammogram for malignant neoplasm of breast: Secondary | ICD-10-CM | POA: Diagnosis not present

## 2020-08-24 MED ORDER — PANTOPRAZOLE SODIUM 40 MG PO TBEC
40.0000 mg | DELAYED_RELEASE_TABLET | Freq: Every day | ORAL | 3 refills | Status: DC | PRN
Start: 2020-08-24 — End: 2022-01-25

## 2020-08-26 ENCOUNTER — Encounter (INDEPENDENT_AMBULATORY_CARE_PROVIDER_SITE_OTHER): Payer: Self-pay | Admitting: Internal Medicine

## 2020-08-26 ENCOUNTER — Other Ambulatory Visit (INDEPENDENT_AMBULATORY_CARE_PROVIDER_SITE_OTHER): Payer: Self-pay | Admitting: Internal Medicine

## 2020-08-26 MED ORDER — LORAZEPAM 0.5 MG PO TABS
ORAL_TABLET | ORAL | 3 refills | Status: DC
Start: 2020-08-26 — End: 2021-08-04

## 2020-09-16 ENCOUNTER — Encounter (INDEPENDENT_AMBULATORY_CARE_PROVIDER_SITE_OTHER): Payer: Self-pay | Admitting: Internal Medicine

## 2020-09-17 ENCOUNTER — Encounter (INDEPENDENT_AMBULATORY_CARE_PROVIDER_SITE_OTHER): Payer: Self-pay | Admitting: Internal Medicine

## 2020-09-17 ENCOUNTER — Other Ambulatory Visit: Payer: Self-pay

## 2020-09-17 ENCOUNTER — Ambulatory Visit (INDEPENDENT_AMBULATORY_CARE_PROVIDER_SITE_OTHER): Payer: Medicare HMO | Admitting: Internal Medicine

## 2020-09-17 VITALS — BP 126/80 | HR 68 | Temp 97.8°F | Resp 18 | Ht 65.0 in | Wt 168.0 lb

## 2020-09-17 DIAGNOSIS — J029 Acute pharyngitis, unspecified: Secondary | ICD-10-CM | POA: Diagnosis not present

## 2020-09-17 NOTE — Progress Notes (Signed)
Metrics: Intervention Frequency ACO  Documented Smoking Status Yearly  Screened one or more times in 24 months  Cessation Counseling or  Active cessation medication Past 24 months  Past 24 months   Guideline developer: UpToDate (See UpToDate for funding source) Date Released: 2014       Wellness Office Visit  Subjective:  Patient ID: Crystal Mccarthy, female    DOB: Mar 27, 1956  Age: 65 y.o. MRN: [id number]  CC: Sore throat HPI This lady comes in for an acute visit with a 24 to 36-hour history of sore throat associated with low-grade fever.  She has not had any postnasal drainage, sinus congestion, dyspnea, body aches.  She has taken 2 COVID-19 test at home and both of been negative in the last 36 hours.  She is fully vaccinated.  Past Medical History:  Diagnosis Date  . Atypical mole 06/25/2020   Right Buttock (moderate)  . Basal cell carcinoma 1995   nose ( [place])  . Depression 05/01/2019  . Essential hypertension, benign Yes  . Hypothyroidism, adult 05/01/2019  . 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 Vitamin D deficiency disease 05/01/2019   Past Surgical History:  Procedure Laterality Date  . BREAST BIOPSY Right 06/2019   negative  . cataract surgery    . COLONOSCOPY  2016   [place]; per patient, 1 polyp with recommendations to repeat in 5 years  . COLONOSCOPY  2010   [place], per patient, normal exam  . COLONOSCOPY WITH PROPOFOL N/A 02/10/2020   Procedure: COLONOSCOPY WITH PROPOFOL;  Surgeon: Daneil Dolin, MD;  [facility];  Service: Endoscopy;  Laterality: N/A;  1:30pm  . POLYPECTOMY  02/10/2020   Procedure: POLYPECTOMY;  Surgeon: Daneil Dolin, MD;  [facility];  Service: Endoscopy;;     Family History  Problem Relation Age of Onset  . Dementia Mother   . Heart disease Father   . Parkinson's disease Father   . Obesity Sister   . Colon cancer Neg Hx     Social History   Social History Narrative   Married for 56  years,lives with husband.Retired.   Social History   Tobacco Use  . Smoking status: Never Smoker  . Smokeless tobacco: Never Used  Substance Use Topics  . Alcohol use: Yes    Comment: occ- special occasions    Current Meds  Medication Sig  . Ascorbic Acid (VITAMIN C) 1000 MG tablet Take 1,000 mg by mouth daily.  Marland Kitchen aspirin EC 81 MG tablet Take 81 mg by mouth at bedtime.   Marland Kitchen buPROPion (WELLBUTRIN XL) 300 MG 24 hr tablet TAKE ONE TABLET BY MOUTH ONE TIME DAILY  . Cholecalciferol (VITAMIN D-3) 125 MCG (5000 UT) TABS Take 5,000 Units by mouth daily.   . Coenzyme Q10 300 MG CAPS Take 300 mg by mouth at bedtime.   Marland Kitchen eletriptan (RELPAX) 40 MG tablet take one tablet by mouth as needed for migraine headache *may repeat in 2 hours if headache persists or recurs*  . estradiol (ESTRACE) 0.5 MG tablet Take 1 tablet (0.5 mg total) by mouth daily.  Marland Kitchen estradiol (ESTRACE) 2 MG tablet Take 1 tablet (2 mg total) by mouth daily.  Marland Kitchen ibuprofen (ADVIL) 800 MG tablet Take 1 tablet (800 mg total) by mouth every 8 (eight) hours as needed.  Javier Docker Oil 500 MG CAPS Take 500 mg by mouth daily.  Marland Kitchen LORazepam (ATIVAN) 0.5 MG tablet TAKE 1 TABLET BY MOUTH ONCE DAILY AS NEEDED FOR ANXIETY  .  losartan-hydrochlorothiazide (HYZAAR) 100-25 MG tablet Take 1 tablet by mouth once daily  . Magnesium 500 MG TABS Take 500 mg by mouth daily.   . Melatonin 3 MG CAPS Take 3 mg by mouth every evening.  . NP THYROID 120 MG tablet Take 120 mg by mouth daily before breakfast.  . pantoprazole (PROTONIX) 40 MG tablet Take 1 tablet (40 mg total) by mouth daily as needed (Heartburn).  . Probiotic Product (ALIGN) 4 MG CAPS Take 4 mg by mouth daily.  . progesterone (PROMETRIUM) 200 MG capsule TAKE TWO CAPSULES BY MOUTH DAILY  . Testosterone 1.62 % GEL Place 2.5 mg onto the skin daily. Testosterone 1% cream, apply 1 click (2.5 mg) daily to the labia.  Marland Kitchen thyroid (NP THYROID) 30 MG tablet Take 1 tablet (30 mg total) by mouth daily before  breakfast. (Patient taking differently: Take 30 mg by mouth daily before breakfast. Take with 120 mg for a total of 150 mg)  . valACYclovir (VALTREX) 1000 MG tablet Take 1,000 mg by mouth daily as needed (Fever Blisters).        Objective:   Today's Vitals: BP 126/80 (BP Location: Left Arm, Patient Position: Sitting, Cuff Size: Normal)   Pulse 68   Temp 97.8 F (36.6 C) (Temporal)   Resp 18   Ht 5\' 5"  (1.651 m)   Wt 168 lb (76.2 kg)   SpO2 98%   BMI 27.96 kg/m  Vitals with BMI 09/17/2020 08/19/2020 05/20/2020  Height 5\' 5"  5\' 5"  5\' 5"   Weight 168 lbs 164 lbs 158 lbs 3 oz  BMI 27.96 76.22 63.33  Systolic 545 625 638  Diastolic 80 80 72  Pulse 68 78 75     Physical Exam  She looks systemically well.  Her throat is very mildly inflamed but there are no exudates.  There is no neck lymphadenopathy.  There is no sinus tenderness.  Lung fields are clear.     Assessment   1. Sore throat       Tests ordered No orders of the defined types were placed in this encounter.    Plan: 1. Rapid strep test was done in the office which was negative. 2. I recommended that she repeat another COVID-19 test tomorrow evening and if this is negative, I think she can be reassured that she does not have COVID-19 disease.  If it is positive a course, she will need to isolate. 3. I expect she will improve if this is another virus or even allergies.  She could do a trial of allergy medicine over-the-counter such as Zyrtec and I have suggested this to her also.   No orders of the defined types were placed in this encounter.   Doree Albee, MD

## 2020-10-08 ENCOUNTER — Telehealth (INDEPENDENT_AMBULATORY_CARE_PROVIDER_SITE_OTHER): Payer: Self-pay | Admitting: Internal Medicine

## 2020-10-08 NOTE — Telephone Encounter (Signed)
Tried calling patient to  schedule Medicare Annual Wellness Visit (AWV) either virtually or in office.  No answer could not leave message   awvi 02/12/19 per [place]  please schedule at anytime with [facility]  health coach  This should be a 40 minute visit.

## 2020-10-11 ENCOUNTER — Other Ambulatory Visit (INDEPENDENT_AMBULATORY_CARE_PROVIDER_SITE_OTHER): Payer: Self-pay | Admitting: Internal Medicine

## 2020-10-13 ENCOUNTER — Encounter (INDEPENDENT_AMBULATORY_CARE_PROVIDER_SITE_OTHER): Payer: Self-pay | Admitting: Internal Medicine

## 2020-10-13 ENCOUNTER — Other Ambulatory Visit (INDEPENDENT_AMBULATORY_CARE_PROVIDER_SITE_OTHER): Payer: Self-pay | Admitting: Internal Medicine

## 2020-10-13 DIAGNOSIS — Z712 Person consulting for explanation of examination or test findings: Secondary | ICD-10-CM | POA: Diagnosis not present

## 2020-10-13 MED ORDER — BUPROPION HCL ER (XL) 300 MG PO TB24
1.0000 | ORAL_TABLET | Freq: Every day | ORAL | 1 refills | Status: DC
Start: 2020-10-13 — End: 2021-03-16

## 2020-11-08 ENCOUNTER — Encounter (INDEPENDENT_AMBULATORY_CARE_PROVIDER_SITE_OTHER): Payer: Self-pay | Admitting: Internal Medicine

## 2020-11-10 ENCOUNTER — Other Ambulatory Visit (INDEPENDENT_AMBULATORY_CARE_PROVIDER_SITE_OTHER): Payer: Self-pay | Admitting: Internal Medicine

## 2020-11-10 MED ORDER — PROGESTERONE 200 MG PO CAPS: 400.0000 mg | ORAL_CAPSULE | Freq: Every day | ORAL | 1 refills | Status: AC

## 2020-11-10 MED ORDER — LOSARTAN POTASSIUM-HCTZ 100-25 MG PO TABS
1.0000 | ORAL_TABLET | Freq: Every day | ORAL | 1 refills | Status: DC
Start: 2020-11-10 — End: 2021-05-10

## 2020-11-25 ENCOUNTER — Ambulatory Visit (INDEPENDENT_AMBULATORY_CARE_PROVIDER_SITE_OTHER): Payer: Medicare HMO | Admitting: Internal Medicine

## 2020-12-06 ENCOUNTER — Encounter (INDEPENDENT_AMBULATORY_CARE_PROVIDER_SITE_OTHER): Payer: Self-pay | Admitting: Internal Medicine

## 2020-12-07 ENCOUNTER — Other Ambulatory Visit (INDEPENDENT_AMBULATORY_CARE_PROVIDER_SITE_OTHER): Payer: Self-pay | Admitting: Internal Medicine

## 2020-12-07 MED ORDER — THYROID 30 MG PO TABS
30.0000 mg | ORAL_TABLET | Freq: Every day | ORAL | 1 refills | Status: DC
Start: 2020-12-07 — End: 2021-03-25

## 2020-12-07 MED ORDER — NP THYROID 120 MG PO TABS
120.0000 mg | ORAL_TABLET | Freq: Every day | ORAL | 1 refills | Status: DC
Start: 2020-12-07 — End: 2021-03-29

## 2020-12-07 MED ORDER — ESTRADIOL 2 MG PO TABS
2.0000 mg | ORAL_TABLET | Freq: Every day | ORAL | 1 refills | Status: DC
Start: 2020-12-07 — End: 2021-09-20

## 2020-12-21 ENCOUNTER — Ambulatory Visit: Payer: Medicare HMO | Admitting: Dermatology

## 2020-12-28 ENCOUNTER — Encounter: Payer: Self-pay | Admitting: Dermatology

## 2020-12-28 ENCOUNTER — Ambulatory Visit (INDEPENDENT_AMBULATORY_CARE_PROVIDER_SITE_OTHER): Payer: Medicare HMO | Admitting: Dermatology

## 2020-12-28 ENCOUNTER — Other Ambulatory Visit: Payer: Self-pay

## 2020-12-28 DIAGNOSIS — Z1283 Encounter for screening for malignant neoplasm of skin: Secondary | ICD-10-CM | POA: Diagnosis not present

## 2020-12-28 DIAGNOSIS — Z86018 Personal history of other benign neoplasm: Secondary | ICD-10-CM

## 2020-12-28 DIAGNOSIS — L821 Other seborrheic keratosis: Secondary | ICD-10-CM | POA: Diagnosis not present

## 2020-12-28 DIAGNOSIS — L814 Other melanin hyperpigmentation: Secondary | ICD-10-CM | POA: Diagnosis not present

## 2020-12-31 ENCOUNTER — Other Ambulatory Visit: Payer: Self-pay

## 2020-12-31 ENCOUNTER — Encounter (INDEPENDENT_AMBULATORY_CARE_PROVIDER_SITE_OTHER): Payer: Self-pay | Admitting: Internal Medicine

## 2020-12-31 ENCOUNTER — Ambulatory Visit (INDEPENDENT_AMBULATORY_CARE_PROVIDER_SITE_OTHER): Payer: Medicare HMO | Admitting: Internal Medicine

## 2020-12-31 VITALS — BP 114/76 | HR 73 | Temp 99.3°F | Ht 65.0 in | Wt 172.6 lb

## 2020-12-31 DIAGNOSIS — E039 Hypothyroidism, unspecified: Secondary | ICD-10-CM

## 2020-12-31 DIAGNOSIS — I10 Essential (primary) hypertension: Secondary | ICD-10-CM | POA: Diagnosis not present

## 2020-12-31 DIAGNOSIS — E2839 Other primary ovarian failure: Secondary | ICD-10-CM | POA: Diagnosis not present

## 2020-12-31 LAB — PROGESTERONE: Progesterone: 63.6 ng/mL

## 2020-12-31 LAB — ESTRADIOL: Estradiol: 209 pg/mL

## 2020-12-31 MED ORDER — CIPROFLOXACIN HCL 500 MG PO TABS: 500.0000 mg | ORAL_TABLET | Freq: Two times a day (BID) | ORAL | 0 refills | Status: AC

## 2020-12-31 NOTE — Progress Notes (Signed)
Metrics: Intervention Frequency ACO  Documented Smoking Status Yearly  Screened one or more times in 24 months  Cessation Counseling or  Active cessation medication Past 24 months  Past 24 months   Guideline developer: UpToDate (See UpToDate for funding source) Date Released: 2014       Wellness Office Visit  Subjective:  Patient ID: Crystal Mccarthy, female    DOB: 06-14-1955  Age: 65 y.o. MRN: [id number]  CC: This lady comes in for follow-up of hypertension, hypothyroidism and B HRT. HPI  She is doing reasonably well.  She has gained some weight. She is able to tolerate a dose of estradiol 2.25 mg daily.  She continues on progesterone at 400 mg at night and also testosterone therapy.  Local application of testosterone cream to the labia has given her some bumps and she prefers to use it elsewhere if possible. Past Medical History:  Diagnosis Date   Atypical mole 06/25/2020   Right Buttock (moderate)   Basal cell carcinoma 1995   nose ( [place])   Depression 05/01/2019   Essential hypertension, benign Yes   Hypothyroidism, adult 05/01/2019   Primary ovarian failure 05/01/2019   Vitamin D deficiency disease 05/01/2019   Past Surgical History:  Procedure Laterality Date   BREAST BIOPSY Right 06/2019   negative   cataract surgery     COLONOSCOPY  2016   [place]; per patient, 1 polyp with recommendations to repeat in 5 years   COLONOSCOPY  2010   [place], per patient, normal exam   COLONOSCOPY WITH PROPOFOL N/A 02/10/2020   Procedure: COLONOSCOPY WITH PROPOFOL;  Surgeon: Daneil Dolin, MD;  [facility];  Service: Endoscopy;  Laterality: N/A;  1:30pm   POLYPECTOMY  02/10/2020   Procedure: POLYPECTOMY;  Surgeon: Daneil Dolin, MD;  [facility];  Service: Endoscopy;;     Family History  Problem Relation Age of Onset   Dementia Mother    Heart disease Father    Parkinson's disease Father    Obesity Sister    Colon cancer Neg Hx      Social History   Social History Narrative   Married for 27 years,lives with husband.Retired.   Social History   Tobacco Use   Smoking status: Never   Smokeless tobacco: Never  Substance Use Topics   Alcohol use: Yes    Comment: occ- special occasions    Current Meds  Medication Sig   Ascorbic Acid (VITAMIN C) 1000 MG tablet Take 1,000 mg by mouth daily.   aspirin EC 81 MG tablet Take 81 mg by mouth at bedtime.    buPROPion (WELLBUTRIN XL) 300 MG 24 hr tablet Take 1 tablet (300 mg total) by mouth daily.   Cholecalciferol (VITAMIN D-3) 125 MCG (5000 UT) TABS Take 5,000 Units by mouth daily.    ciprofloxacin (CIPRO) 500 MG tablet Take 1 tablet (500 mg total) by mouth 2 (two) times daily for 10 days.   Coenzyme Q10 300 MG CAPS Take 300 mg by mouth at bedtime.    eletriptan (RELPAX) 40 MG tablet take 1 tablet by mouth as needed for migraine headache**may repeat in 2 hrs if  persists or recurs**   estradiol (ESTRACE) 0.5 MG tablet Take 1 tablet (0.5 mg total) by mouth daily.   estradiol (ESTRACE) 2 MG tablet Take 1 tablet (2 mg total) by mouth daily.   ibuprofen (ADVIL) 800 MG tablet Take 1 tablet (800 mg total) by mouth every 8 (eight) hours as needed.  Krill Oil 500 MG CAPS Take 500 mg by mouth daily.   LORazepam (ATIVAN) 0.5 MG tablet TAKE 1 TABLET BY MOUTH ONCE DAILY AS NEEDED FOR ANXIETY   losartan-hydrochlorothiazide (HYZAAR) 100-25 MG tablet Take 1 tablet by mouth daily.   Magnesium 500 MG TABS Take 500 mg by mouth daily.    Melatonin 3 MG CAPS Take 3 mg by mouth every evening.   NP THYROID 120 MG tablet Take 1 tablet (120 mg total) by mouth daily before breakfast.   pantoprazole (PROTONIX) 40 MG tablet Take 1 tablet (40 mg total) by mouth daily as needed (Heartburn).   Probiotic Product (ALIGN) 4 MG CAPS Take 4 mg by mouth daily.   progesterone (PROMETRIUM) 200 MG capsule Take 2 capsules (400 mg total) by mouth daily.   SHINGRIX injection    Testosterone 1.62 % GEL Place  2.5 mg onto the skin daily. Testosterone 1% cream, apply 1 click (2.5 mg) daily to the labia.   thyroid (NP THYROID) 30 MG tablet Take 1 tablet (30 mg total) by mouth daily before breakfast.   valACYclovir (VALTREX) 1000 MG tablet Take 1,000 mg by mouth daily as needed (Fever Blisters).        Objective:   Today's Vitals: BP 114/76   Pulse 73   Temp 99.3 F (37.4 C) (Temporal)   Ht 5\' 5"  (1.651 m)   Wt 172 lb 9.6 oz (78.3 kg)   SpO2 97%   BMI 28.72 kg/m  Vitals with BMI 12/31/2020 09/17/2020 08/19/2020  Height 5\' 5"  5\' 5"  5\' 5"   Weight 172 lbs 10 oz 168 lbs 164 lbs  BMI 28.72 78.93 81.01  Systolic 751 025 852  Diastolic 76 80 80  Pulse 73 68 78     Physical Exam  She has gained 4 pounds back.  Blood pressure is excellent.     Assessment   1. Essential hypertension, benign   2. Hypothyroidism, adult   3. Primary ovarian failure       Tests ordered Orders Placed This Encounter  Procedures   Estradiol   Progesterone      Plan: 1.  Continue with estradiol and progesterone at current doses and I will check levels to see if we need to optimize further or not. 2.  Continue with all the other medications as before. 3.  I will see her in January for an annual physical exam.    Meds ordered this encounter  Medications   ciprofloxacin (CIPRO) 500 MG tablet    Sig: Take 1 tablet (500 mg total) by mouth 2 (two) times daily for 10 days.    Dispense:  20 tablet    Refill:  0     Fotini Lemus Luther Parody, MD

## 2021-01-01 ENCOUNTER — Encounter (INDEPENDENT_AMBULATORY_CARE_PROVIDER_SITE_OTHER): Payer: Self-pay | Admitting: Internal Medicine

## 2021-01-04 ENCOUNTER — Encounter: Payer: Self-pay | Admitting: Dermatology

## 2021-01-04 ENCOUNTER — Ambulatory Visit (INDEPENDENT_AMBULATORY_CARE_PROVIDER_SITE_OTHER): Payer: Medicare HMO | Admitting: Internal Medicine

## 2021-01-04 NOTE — Progress Notes (Signed)
   Follow-Up Visit   Subjective  Crystal Mccarthy is a 65 y.o. female who presents for the following: Follow-up (3 month f/u right buttock- healing good no concerns- wants to know if anything she can use for dark/brown spots on face).  Recheck dysplastic mole right buttocks plus several other skin issues to discuss Location:  Duration:  Quality:  Associated Signs/Symptoms: Modifying Factors:  Severity:  Timing: Context:   Objective  Well appearing patient in no apparent distress; mood and affect are within normal limits. Left Submandibular Area, Right Abdomen (side) - Lower Flattopped brown 5 mm textured papule  Torso - Posterior (Back) Head to toe exam no signs of atypical moles, melanoma or non mole skin cancer.   Right Buccal Cheek Mixture of lentigines plus/keratoses.    A focused examination was performed including head, neck, back, arms, buttocks.. Relevant physical exam findings are noted in the Assessment and Plan.   Assessment & Plan    Seborrheic keratosis (2) Right Abdomen (side) - Lower; Left Submandibular Area  Benign okay to leave. Can LN2 in the winter if patient would like.   Skin exam for malignant neoplasm Torso - Posterior (Back)  Yearly skin check   Lentigines Right Buccal Cheek  Treatment options discussed included a benign neglect, topical lightening agents like Tri-Luma, freezing, peel, and light based therapy.  May try LN2 in winter.      I, [name], MD, have reviewed all documentation for this visit.  The documentation on 01/04/21 for the exam, diagnosis, procedures, and orders are all accurate and complete.

## 2021-01-26 ENCOUNTER — Telehealth (INDEPENDENT_AMBULATORY_CARE_PROVIDER_SITE_OTHER): Payer: Self-pay

## 2021-01-26 NOTE — Telephone Encounter (Signed)
Patient called and left a detailed voice message that she is very upset because she feels like this is such short notice and wants to have a new provider and needs medications refilled.  I called the patient back and left a detailed voice message on some new providers to try to see if they are accepting new patients and also the call center number so they can help her. Patient may send a MyChart message to let you know exactly what medications she needs or she may call back and leave a VM or have her pharmacy send request. Just wanted to make you aware.

## 2021-01-27 ENCOUNTER — Telehealth (INDEPENDENT_AMBULATORY_CARE_PROVIDER_SITE_OTHER): Payer: Self-pay

## 2021-01-27 DIAGNOSIS — Z0100 Encounter for examination of eyes and vision without abnormal findings: Secondary | ICD-10-CM | POA: Diagnosis not present

## 2021-01-27 DIAGNOSIS — H5210 Myopia, unspecified eye: Secondary | ICD-10-CM | POA: Diagnosis not present

## 2021-01-27 NOTE — Telephone Encounter (Signed)
Patient called back and stated that she needs the testosterone refilled. I gave the patient the number to [facility] and she will reach out to them to get an appointment and asked for a refill to help her.

## 2021-01-27 NOTE — Telephone Encounter (Signed)
Received a refill request from [facility] for the following medication and will place fax in your folder for you to review the testosterone cream request.

## 2021-01-28 NOTE — Telephone Encounter (Signed)
Called patient and she stated that she is out of her cream and would like to have a partial refill and she is reaching out to an alternative doctor. Patient verbalized an understanding and thanked [place] for all of our care. Please send to [facility].

## 2021-02-01 DIAGNOSIS — Z0100 Encounter for examination of eyes and vision without abnormal findings: Secondary | ICD-10-CM | POA: Diagnosis not present

## 2021-02-10 DIAGNOSIS — Z20822 Contact with and (suspected) exposure to covid-19: Secondary | ICD-10-CM | POA: Diagnosis not present

## 2021-03-11 DIAGNOSIS — N959 Unspecified menopausal and perimenopausal disorder: Secondary | ICD-10-CM | POA: Diagnosis not present

## 2021-03-11 DIAGNOSIS — Z01419 Encounter for gynecological examination (general) (routine) without abnormal findings: Secondary | ICD-10-CM | POA: Diagnosis not present

## 2021-03-11 DIAGNOSIS — Z124 Encounter for screening for malignant neoplasm of cervix: Secondary | ICD-10-CM | POA: Diagnosis not present

## 2021-03-16 ENCOUNTER — Ambulatory Visit (INDEPENDENT_AMBULATORY_CARE_PROVIDER_SITE_OTHER): Payer: Medicare HMO | Admitting: Internal Medicine

## 2021-03-16 ENCOUNTER — Encounter: Payer: Self-pay | Admitting: Internal Medicine

## 2021-03-16 ENCOUNTER — Other Ambulatory Visit: Payer: Self-pay

## 2021-03-16 VITALS — BP 148/91 | HR 77 | Temp 98.3°F | Resp 18 | Ht 62.5 in | Wt 171.1 lb

## 2021-03-16 DIAGNOSIS — Z7689 Persons encountering health services in other specified circumstances: Secondary | ICD-10-CM

## 2021-03-16 DIAGNOSIS — F3341 Major depressive disorder, recurrent, in partial remission: Secondary | ICD-10-CM | POA: Diagnosis not present

## 2021-03-16 DIAGNOSIS — E782 Mixed hyperlipidemia: Secondary | ICD-10-CM | POA: Diagnosis not present

## 2021-03-16 DIAGNOSIS — E039 Hypothyroidism, unspecified: Secondary | ICD-10-CM

## 2021-03-16 DIAGNOSIS — K219 Gastro-esophageal reflux disease without esophagitis: Secondary | ICD-10-CM | POA: Diagnosis not present

## 2021-03-16 DIAGNOSIS — I10 Essential (primary) hypertension: Secondary | ICD-10-CM | POA: Diagnosis not present

## 2021-03-16 DIAGNOSIS — Z23 Encounter for immunization: Secondary | ICD-10-CM

## 2021-03-16 DIAGNOSIS — E2839 Other primary ovarian failure: Secondary | ICD-10-CM

## 2021-03-16 DIAGNOSIS — G43709 Chronic migraine without aura, not intractable, without status migrainosus: Secondary | ICD-10-CM

## 2021-03-16 DIAGNOSIS — R69 Illness, unspecified: Secondary | ICD-10-CM | POA: Diagnosis not present

## 2021-03-16 MED ORDER — BUPROPION HCL ER (XL) 300 MG PO TB24: 300.0000 mg | ORAL_TABLET | Freq: Every day | ORAL | 1 refills | Status: DC

## 2021-03-16 MED ORDER — ELETRIPTAN HYDROBROMIDE 40 MG PO TABS: 40.0000 mg | ORAL_TABLET | ORAL | 1 refills | Status: DC | PRN

## 2021-03-16 NOTE — Assessment & Plan Note (Signed)
On estradiol and progesterone, followed by OB/GYN.

## 2021-03-16 NOTE — Assessment & Plan Note (Signed)
Well-controlled with Wellbutrin Ativan as needed for anxiety

## 2021-03-16 NOTE — Assessment & Plan Note (Signed)
BP Readings from Last 1 Encounters:  03/16/21 (!) 148/91   Elevated today, could be due to first visit related anxiety/nervousness Appears to be overall well-controlled with Losartan-HCTZ Counseled for compliance with the medications Advised DASH diet and moderate exercise/walking, at least 150 mins/week

## 2021-03-16 NOTE — Assessment & Plan Note (Signed)
Takes Eletriptan PRN

## 2021-03-16 NOTE — Progress Notes (Signed)
New Patient Office Visit  Subjective:  Patient ID: [name], female    DOB: 02-12-1956  Age: 65 y.o. MRN: [id number]  CC:  Chief Complaint  Patient presents with   New Patient (Initial Visit)    New patient was seeing dr Anastasio Champion    HPI Crystal Mccarthy is a 65 year old female with PMH of HTN, migraine, GERD, hypothyroidism, depression with anxiety who presents for establishing care.  Her BP was elevated in the office today.  She states that her BP usually remains WNL with losartan HCTZ.  She denies any headache, dizziness, chest pain, dyspnea or palpitations currently.  She is on NP thyroid 150 mg once daily.  Chart review suggest very low TSH in 12/2019, after which her dose of NP thyroid was actually increased from 120 mg to 150 mg.  She denies any recent change in weight or appetite.  She takes Relpax for migraine as needed.  Requires it about 2-3 times in a month usually.  Denies any headache, nausea or vomiting currently.  She takes Wellbutrin for depression.  She takes Ativan rarely for anxiety.  She is on HRT with Estradiol and Progesterone and follows up with Ob/Gyn.  She has had 4 doses of COVID-vaccine.  She received flu vaccine in the office today.  Past Medical History:  Diagnosis Date   Atypical mole 06/25/2020   Right Buttock (moderate)   Basal cell carcinoma 1995   nose ( [place])   Depression 05/01/2019   Essential hypertension, benign Yes   Hypothyroidism, adult 05/01/2019   Primary ovarian failure 05/01/2019   Vitamin D deficiency disease 05/01/2019    Past Surgical History:  Procedure Laterality Date   BREAST BIOPSY Right 06/2019   negative   cataract surgery     COLONOSCOPY  2016   [place]; per patient, 1 polyp with recommendations to repeat in 5 years   COLONOSCOPY  2010   [place], per patient, normal exam   COLONOSCOPY WITH PROPOFOL N/A 02/10/2020   Procedure: COLONOSCOPY WITH PROPOFOL;  Surgeon: Daneil Dolin, MD;   [facility];  Service: Endoscopy;  Laterality: N/A;  1:30pm   POLYPECTOMY  02/10/2020   Procedure: POLYPECTOMY;  Surgeon: Daneil Dolin, MD;  [facility];  Service: Endoscopy;;    Family History  Problem Relation Age of Onset   Dementia Mother    Heart disease Father    Parkinson's disease Father    Obesity Sister    Colon cancer Neg Hx     Social History   Socioeconomic History   Marital status: Married    Spouse name: Not on file   Number of children: Not on file   Years of education: Not on file   Highest education level: Not on file  Occupational History   Not on file  Tobacco Use   Smoking status: Never   Smokeless tobacco: Never  Vaping Use   Vaping Use: Never used  Substance and Sexual Activity   Alcohol use: Yes    Comment: occ- special occasions   Drug use: Never   Sexual activity: Yes  Other Topics Concern   Not on file  Social History Narrative   Married for 45 years,lives with husband.Retired.   Social Determinants of Health   Financial Resource Strain: Not on file  Food Insecurity: Not on file  Transportation Needs: Not on file  Physical Activity: Not on file  Stress: Not on file  Social Connections: Not on file  Intimate  Partner Violence: Not on file    ROS Review of Systems  Constitutional:  Negative for chills and fever.  HENT:  Negative for congestion, sinus pressure, sinus pain and sore throat.   Eyes:  Negative for pain and discharge.  Respiratory:  Negative for cough and shortness of breath.   Cardiovascular:  Negative for chest pain and palpitations.  Gastrointestinal:  Negative for abdominal pain, diarrhea, nausea and vomiting.  Endocrine: Negative for polydipsia and polyuria.  Genitourinary:  Negative for dysuria and hematuria.  Musculoskeletal:  Negative for neck pain and neck stiffness.  Skin:  Negative for rash.  Neurological:  Negative for dizziness and weakness.  Psychiatric/Behavioral:  Negative for  agitation and behavioral problems.    Objective:   Today's Vitals: BP (!) 148/91 (BP Location: Left Arm, Patient Position: Sitting, Cuff Size: Normal)   Pulse 77   Temp 98.3 F (36.8 C) (Oral)   Resp 18   Ht 5' 2.5" (1.588 m)   Wt 171 lb 1.9 oz (77.6 kg)   SpO2 93%   BMI 30.80 kg/m   Physical Exam Vitals reviewed.  Constitutional:      General: She is not in acute distress.    Appearance: She is not diaphoretic.  HENT:     Head: Normocephalic and atraumatic.     Nose: Nose normal.     Mouth/Throat:     Mouth: Mucous membranes are moist.  Eyes:     General: No scleral icterus.    Extraocular Movements: Extraocular movements intact.  Cardiovascular:     Rate and Rhythm: Normal rate and regular rhythm.     Pulses: Normal pulses.     Heart sounds: Normal heart sounds. No murmur heard. Pulmonary:     Breath sounds: Normal breath sounds. No wheezing or rales.  Musculoskeletal:     Cervical back: Neck supple. No tenderness.     Right lower leg: No edema.     Left lower leg: No edema.  Skin:    General: Skin is warm.     Findings: No rash.  Neurological:     General: No focal deficit present.     Mental Status: She is alert and oriented to person, place, and time.  Psychiatric:        Mood and Affect: Mood normal.        Behavior: Behavior normal.    Assessment & Plan:   Problem List Items Addressed This Visit       Encounter to establish care - Primary   Care established History and medications reviewed with the patient       Cardiovascular and Mediastinum   Essential hypertension, benign    BP Readings from Last 1 Encounters:  03/16/21 (!) 148/91  Elevated today, could be due to first visit related anxiety/nervousness Appears to be overall well-controlled with Losartan-HCTZ Counseled for compliance with the medications Advised DASH diet and moderate exercise/walking, at least 150 mins/week       Relevant Orders   CBC with Differential/Platelet    CMP14+EGFR   Migraine    Takes Eletriptan PRN      Relevant Medications   buPROPion (WELLBUTRIN XL) 300 MG 24 hr tablet   eletriptan (RELPAX) 40 MG tablet     Digestive   GERD (gastroesophageal reflux disease)    On Protonix as needed        Endocrine   Hypothyroidism, adult    Lab Results  Component Value Date   TSH 0.02 (L) 01/06/2020  On  NP thyroid 150 mg daily Appears to be oversupplemented Check TSH and free T4 Discussed about titrating NP thyroid and switching to Levothyroxine later      Relevant Orders   TSH+T4F+T3Free   Primary ovarian failure    On estradiol and progesterone, followed by OB/GYN.        Other   Depression    Well-controlled with Wellbutrin Ativan as needed for anxiety      Relevant Medications   buPROPion (WELLBUTRIN XL) 300 MG 24 hr tablet         Mixed hyperlipidemia    Last lipid profile reviewed (2021) Check repeat lipid profile      Relevant Orders   Lipid panel   Other Visit Diagnoses     Need for immunization against influenza       Relevant Orders   Flu Vaccine QUAD High Dose(Fluad) (Completed)       Outpatient Encounter Medications as of 03/16/2021  Medication Sig   Ascorbic Acid (VITAMIN C) 1000 MG tablet Take 1,000 mg by mouth daily.   aspirin EC 81 MG tablet Take 81 mg by mouth at bedtime.    Cholecalciferol (VITAMIN D-3) 125 MCG (5000 UT) TABS Take 5,000 Units by mouth daily.    Coenzyme Q10 300 MG CAPS Take 300 mg by mouth at bedtime.    estradiol (ESTRACE) 0.5 MG tablet Take 1 tablet (0.5 mg total) by mouth daily.   estradiol (ESTRACE) 2 MG tablet Take 1 tablet (2 mg total) by mouth daily.   Krill Oil 500 MG CAPS Take 500 mg by mouth daily.   LORazepam (ATIVAN) 0.5 MG tablet TAKE 1 TABLET BY MOUTH ONCE DAILY AS NEEDED FOR ANXIETY   losartan-hydrochlorothiazide (HYZAAR) 100-25 MG tablet Take 1 tablet by mouth daily.   Magnesium 500 MG TABS Take 500 mg by mouth daily.    Melatonin 3 MG CAPS Take 3 mg by mouth  every evening.   NP THYROID 120 MG tablet Take 1 tablet (120 mg total) by mouth daily before breakfast.   pantoprazole (PROTONIX) 40 MG tablet Take 1 tablet (40 mg total) by mouth daily as needed (Heartburn).   Probiotic Product (ALIGN) 4 MG CAPS Take 4 mg by mouth daily.   progesterone (PROMETRIUM) 200 MG capsule Take 2 capsules (400 mg total) by mouth daily.   Testosterone 1.62 % GEL Place 2.5 mg onto the skin daily. Testosterone 1% cream, apply 1 click (2.5 mg) daily to the labia.   thyroid (NP THYROID) 30 MG tablet Take 1 tablet (30 mg total) by mouth daily before breakfast.   valACYclovir (VALTREX) 1000 MG tablet Take 1,000 mg by mouth daily as needed (Fever Blisters).    [DISCONTINUED] buPROPion (WELLBUTRIN XL) 300 MG 24 hr tablet Take 1 tablet (300 mg total) by mouth daily.   [DISCONTINUED] eletriptan (RELPAX) 40 MG tablet take 1 tablet by mouth as needed for migraine headache**may repeat in 2 hrs if  persists or recurs**   buPROPion (WELLBUTRIN XL) 300 MG 24 hr tablet Take 1 tablet (300 mg total) by mouth daily.   eletriptan (RELPAX) 40 MG tablet Take 1 tablet (40 mg total) by mouth every 2 (two) hours as needed for migraine or headache.   [DISCONTINUED] acetaZOLAMIDE (DIAMOX) 250 MG tablet Take 250 mg by mouth daily. (Patient not taking: No sig reported)   [DISCONTINUED] azithromycin (ZITHROMAX) 250 MG tablet Take by mouth as directed. (Patient not taking: No sig reported)   [DISCONTINUED] ibuprofen (ADVIL) 800 MG tablet Take 1  tablet (800 mg total) by mouth every 8 (eight) hours as needed. (Patient not taking: Reported on 03/16/2021)   [DISCONTINUED] SHINGRIX injection  (Patient not taking: Reported on 03/16/2021)   No facility-administered encounter medications on file as of 03/16/2021.    Follow-up: Return in about 3 months (around 06/16/2021) for HTN and hypothyroidism.   Lindell Spar, MD

## 2021-03-16 NOTE — Assessment & Plan Note (Signed)
Lab Results  Component Value Date   TSH 0.02 (L) 01/06/2020   On NP thyroid 150 mg daily Appears to be oversupplemented Check TSH and free T4 Discussed about titrating NP thyroid and switching to Levothyroxine later

## 2021-03-16 NOTE — Assessment & Plan Note (Addendum)
Last lipid profile reviewed (2021) Check repeat lipid profile 

## 2021-03-16 NOTE — Assessment & Plan Note (Signed)
On Protonix as needed

## 2021-03-16 NOTE — Assessment & Plan Note (Signed)
Care established History and medications reviewed with the patient 

## 2021-03-16 NOTE — Patient Instructions (Signed)
Please continue taking medications as prescribed.  Please continue to follow low salt diet and perform moderate exercise/walking at least 150 mins/week. 

## 2021-03-24 DIAGNOSIS — E782 Mixed hyperlipidemia: Secondary | ICD-10-CM | POA: Diagnosis not present

## 2021-03-24 DIAGNOSIS — E039 Hypothyroidism, unspecified: Secondary | ICD-10-CM | POA: Diagnosis not present

## 2021-03-24 DIAGNOSIS — I10 Essential (primary) hypertension: Secondary | ICD-10-CM | POA: Diagnosis not present

## 2021-03-25 ENCOUNTER — Other Ambulatory Visit: Payer: Self-pay | Admitting: Internal Medicine

## 2021-03-25 LAB — CMP14+EGFR
ALT: 11 IU/L (ref 0–32)
AST: 16 IU/L (ref 0–40)
Albumin/Globulin Ratio: 1.4 (ref 1.2–2.2)
Albumin: 3.9 g/dL (ref 3.8–4.8)
Alkaline Phosphatase: 83 IU/L (ref 44–121)
BUN/Creatinine Ratio: 20 (ref 12–28)
BUN: 19 mg/dL (ref 8–27)
Bilirubin Total: 0.3 mg/dL (ref 0.0–1.2)
CO2: 24 mmol/L (ref 20–29)
Calcium: 9.8 mg/dL (ref 8.7–10.3)
Chloride: 101 mmol/L (ref 96–106)
Creatinine, Ser: 0.94 mg/dL (ref 0.57–1.00)
Globulin, Total: 2.7 g/dL (ref 1.5–4.5)
Glucose: 90 mg/dL (ref 70–99)
Potassium: 4 mmol/L (ref 3.5–5.2)
Sodium: 139 mmol/L (ref 134–144)
Total Protein: 6.6 g/dL (ref 6.0–8.5)
eGFR: 67 mL/min/{1.73_m2} (ref >59)

## 2021-03-25 LAB — CBC WITH DIFFERENTIAL/PLATELET
Basophils Absolute: 0 10*3/uL (ref 0.0–0.2)
Basos: 1 %
EOS (ABSOLUTE): 0.1 10*3/uL (ref 0.0–0.4)
Eos: 2 %
Hematocrit: 42.6 % (ref 34.0–46.6)
Hemoglobin: 14.4 g/dL (ref 11.1–15.9)
Immature Grans (Abs): 0 10*3/uL (ref 0.0–0.1)
Immature Granulocytes: 0 %
Lymphocytes Absolute: 1.1 10*3/uL (ref 0.7–3.1)
Lymphs: 23 %
MCH: 30.6 pg (ref 26.6–33.0)
MCHC: 33.8 g/dL (ref 31.5–35.7)
MCV: 90 fL (ref 79–97)
Monocytes Absolute: 0.4 10*3/uL (ref 0.1–0.9)
Monocytes: 9 %
Neutrophils Absolute: 3.2 10*3/uL (ref 1.4–7.0)
Neutrophils: 65 %
Platelets: 390 10*3/uL (ref 150–450)
RBC: 4.71 x10E6/uL (ref 3.77–5.28)
RDW: 11.5 % — ABNORMAL LOW (ref 11.7–15.4)
WBC: 4.8 10*3/uL (ref 3.4–10.8)

## 2021-03-25 LAB — TSH+T4F+T3FREE
Free T4: 0.95 ng/dL (ref 0.82–1.77)
T3, Free: 2.3 pg/mL (ref 2.0–4.4)
TSH: 0.153 u[IU]/mL — ABNORMAL LOW (ref 0.450–4.500)

## 2021-03-25 LAB — LIPID PANEL
Chol/HDL Ratio: 4.3 ratio (ref 0.0–4.4)
Cholesterol, Total: 243 mg/dL — ABNORMAL HIGH (ref 100–199)
HDL: 56 mg/dL (ref >39)
LDL Chol Calc (NIH): 161 mg/dL — ABNORMAL HIGH (ref 0–99)
Triglycerides: 144 mg/dL (ref 0–149)
VLDL Cholesterol Cal: 26 mg/dL (ref 5–40)

## 2021-03-26 ENCOUNTER — Telehealth: Payer: Self-pay | Admitting: Internal Medicine

## 2021-03-26 NOTE — Telephone Encounter (Signed)
Pt called about labs

## 2021-03-29 ENCOUNTER — Encounter: Payer: Self-pay | Admitting: Internal Medicine

## 2021-03-29 ENCOUNTER — Other Ambulatory Visit: Payer: Self-pay | Admitting: Internal Medicine

## 2021-03-29 DIAGNOSIS — E039 Hypothyroidism, unspecified: Secondary | ICD-10-CM

## 2021-03-29 MED ORDER — THYROID 60 MG PO TABS: 60.0000 mg | ORAL_TABLET | Freq: Every day | ORAL | 1 refills | Status: DC

## 2021-03-29 NOTE — Telephone Encounter (Signed)
Pt advised of lab results with verbal understanding  

## 2021-03-29 NOTE — Telephone Encounter (Signed)
LVM for pt to call the office regarding lab results

## 2021-03-29 NOTE — Telephone Encounter (Signed)
Pt returned call regarding labs 

## 2021-03-30 ENCOUNTER — Encounter: Payer: Self-pay | Admitting: Internal Medicine

## 2021-03-30 ENCOUNTER — Other Ambulatory Visit: Payer: Self-pay | Admitting: *Deleted

## 2021-03-30 ENCOUNTER — Other Ambulatory Visit: Payer: Self-pay | Admitting: Internal Medicine

## 2021-03-30 DIAGNOSIS — E039 Hypothyroidism, unspecified: Secondary | ICD-10-CM

## 2021-03-30 MED ORDER — THYROID 60 MG PO TABS: 60.0000 mg | ORAL_TABLET | Freq: Every day | ORAL | 1 refills | Status: DC

## 2021-04-03 ENCOUNTER — Encounter: Payer: Self-pay | Admitting: Internal Medicine

## 2021-04-05 ENCOUNTER — Other Ambulatory Visit: Payer: Self-pay | Admitting: Internal Medicine

## 2021-04-05 DIAGNOSIS — E039 Hypothyroidism, unspecified: Secondary | ICD-10-CM

## 2021-04-05 MED ORDER — LEVOTHYROXINE SODIUM 100 MCG PO TABS: 100.0000 ug | ORAL_TABLET | Freq: Every day | ORAL | 0 refills | Status: DC

## 2021-04-05 NOTE — Telephone Encounter (Signed)
Spoke with Crystal Mccarthy 04-05-21

## 2021-05-01 IMAGING — MR MR LUMBAR SPINE W/O CM
5 series · 31 of 48 positions shown · non-contrast
Comparison: Radiography 01/29/2020.

CLINICAL DATA: Spondylolisthesis. Left sided pain radiating to the
ankle for the last several months.

EXAM:
MRI LUMBAR SPINE WITHOUT CONTRAST
TECHNIQUE: Multiplanar, multisequence MR imaging of the lumbar spine was
performed. No intravenous contrast was administered.

[Series 5: T2 · sagittal · 4.0mm · 0.68mm/px · 8 of 16 slices shown (1 of 2)]
[im 1/16]
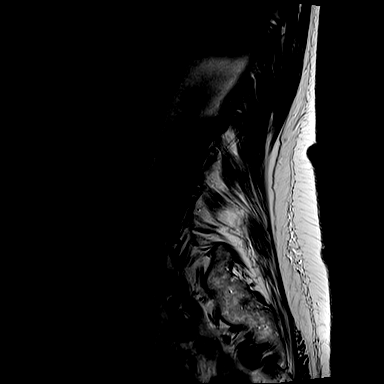
[im 3/16]
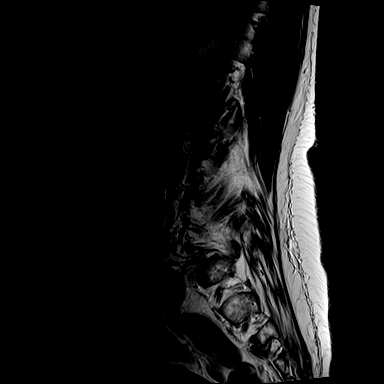
[im 5/16]
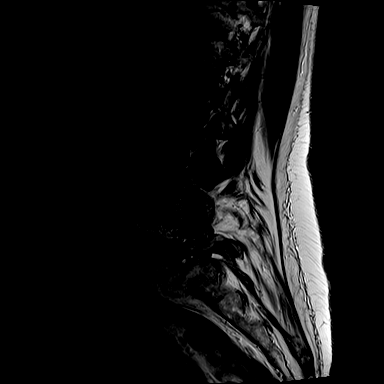
[im 7/16]
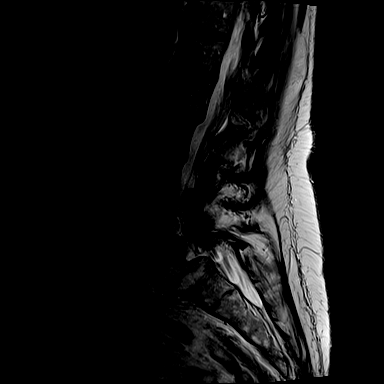
[im 9/16]
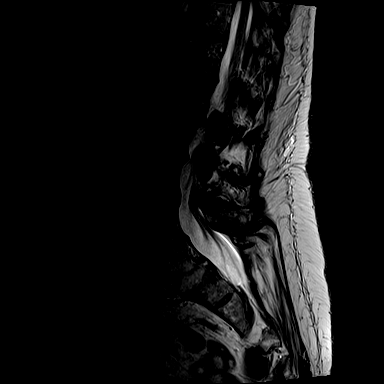
[im 11/16]
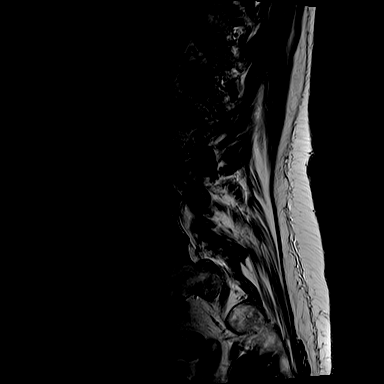
[im 13/16]
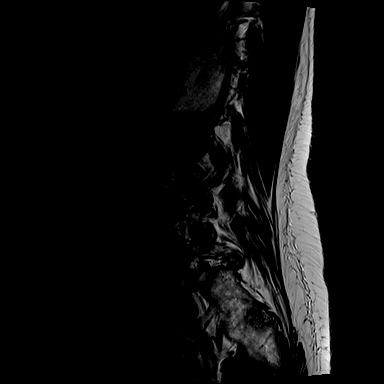
[im 16/16]
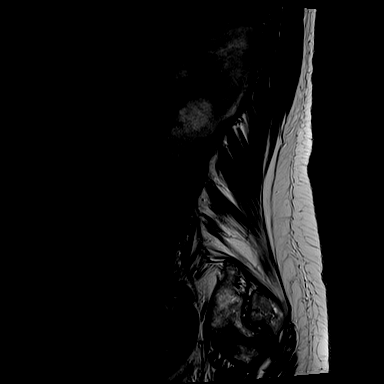

[Series 6: T1 · sagittal · 4.0mm · 0.81mm/px · 6 of 15 slices shown (1 of 2)]
[im 1/15]
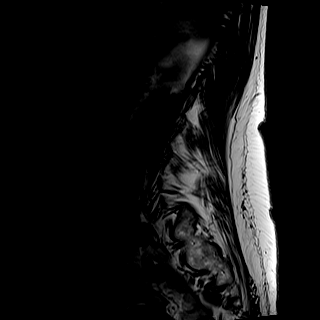
[im 3/15]
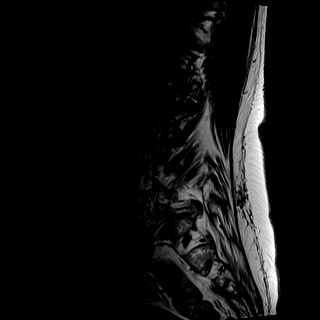
[im 6/15]
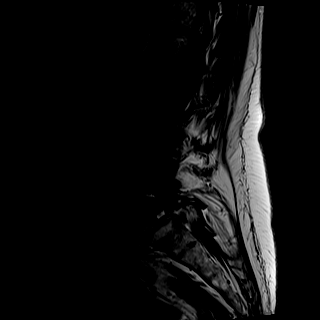
[im 9/15]
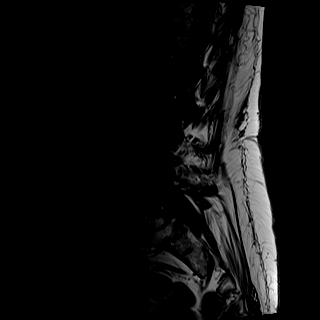
[im 12/15]
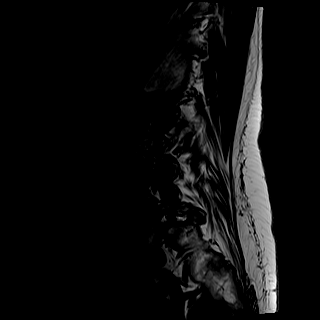
[im 15/15]
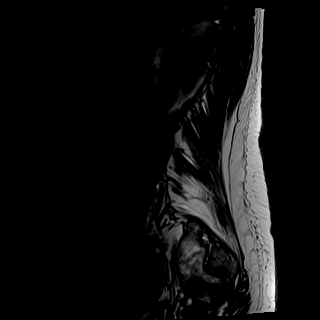

[Series 7: STIR · sagittal · 4.0mm · 0.51mm/px · 1 of 15 slices shown]
[im 1/15]
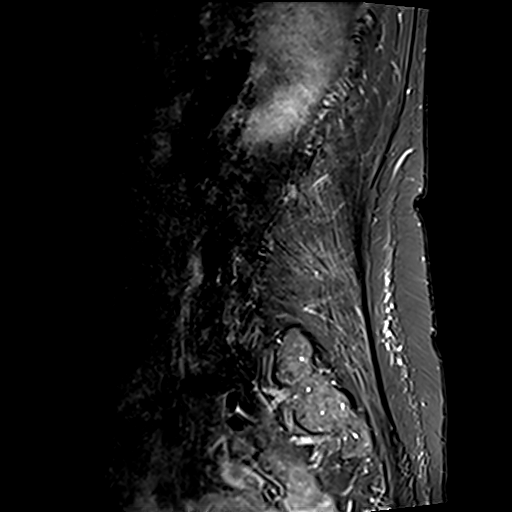

[Series 8: T2 · axial · 4.0mm · 0.70mm/px · z∈[-71,+130]mm · 8 of 33 slices shown (2 of 2)]
[im 1/33]
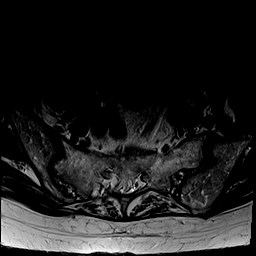
[im 5/33]
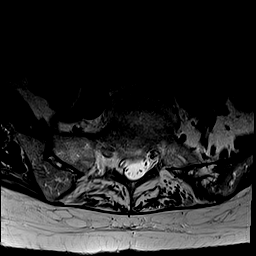
[im 10/33]
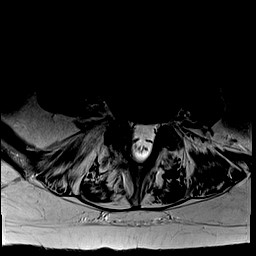
[im 15/33]
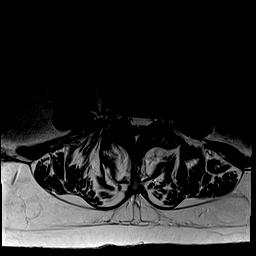
[im 18/33]
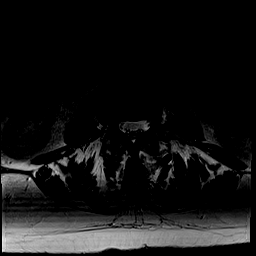
[im 23/33]
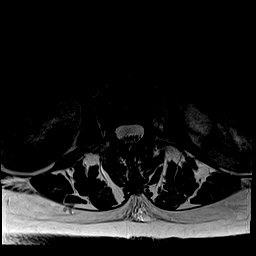
[im 28/33]
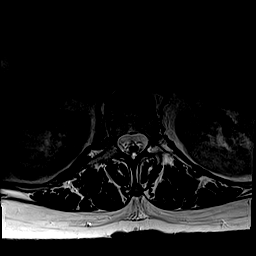
[im 33/33]
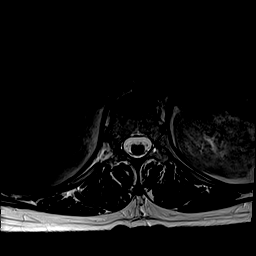

[Series 9: T1 · axial · 4.0mm · 0.35mm/px · z∈[-71,+130]mm · 8 of 33 slices shown (2 of 2)]
[im 1/33]
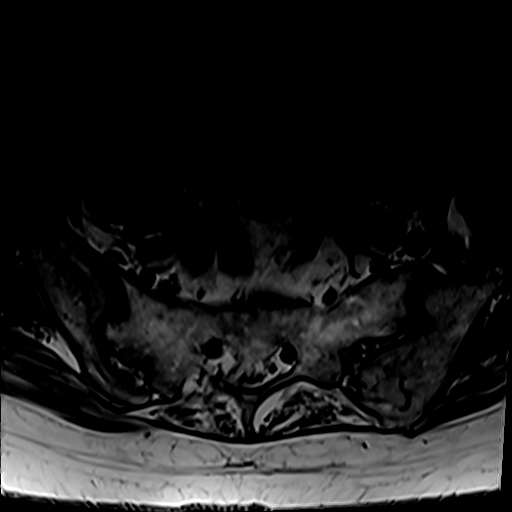
[im 5/33]
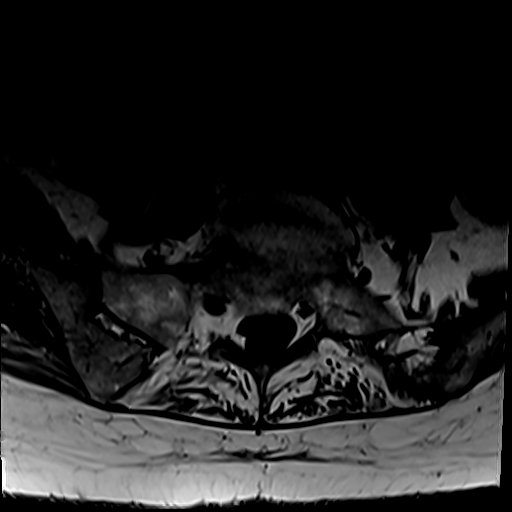
[im 10/33]
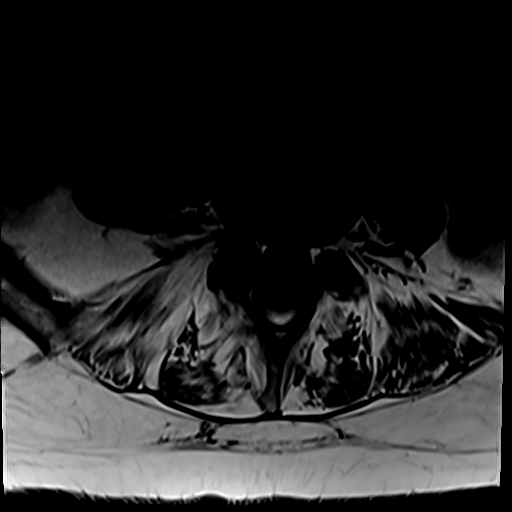
[im 15/33]
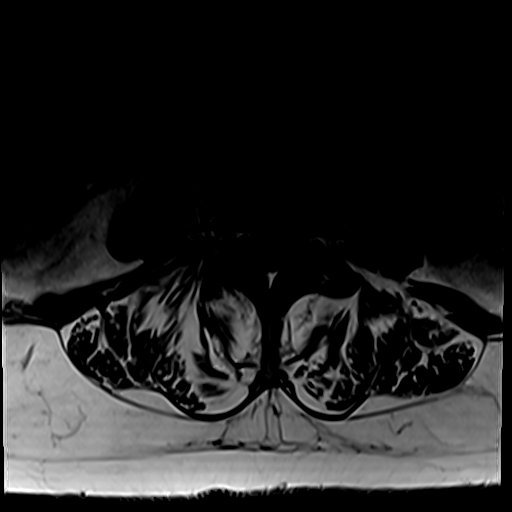
[im 18/33]
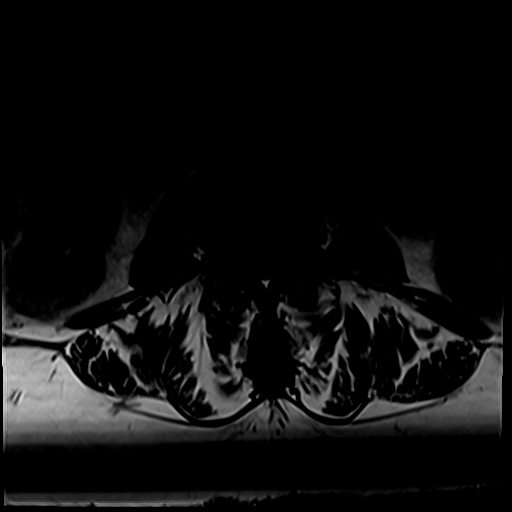
[im 23/33]
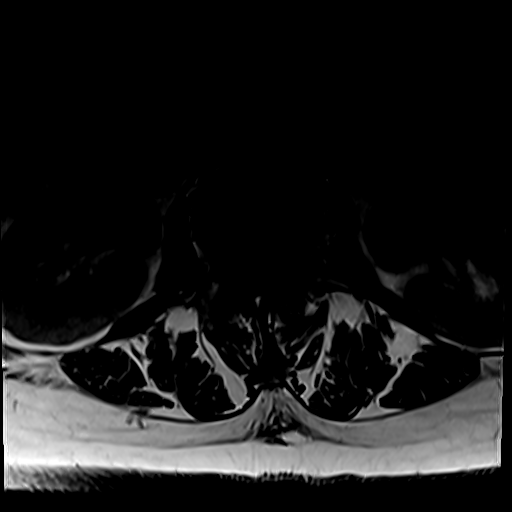
[im 28/33]
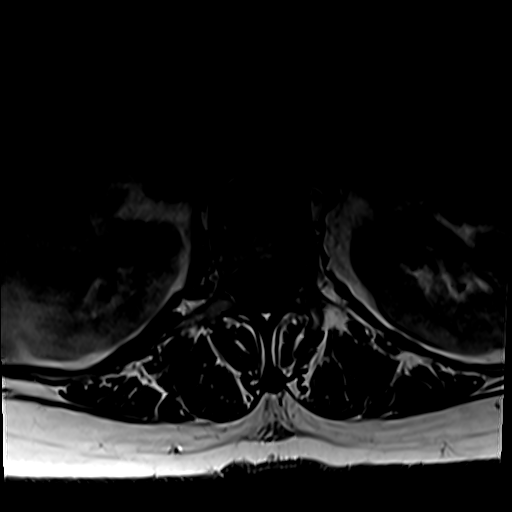
[im 33/33]
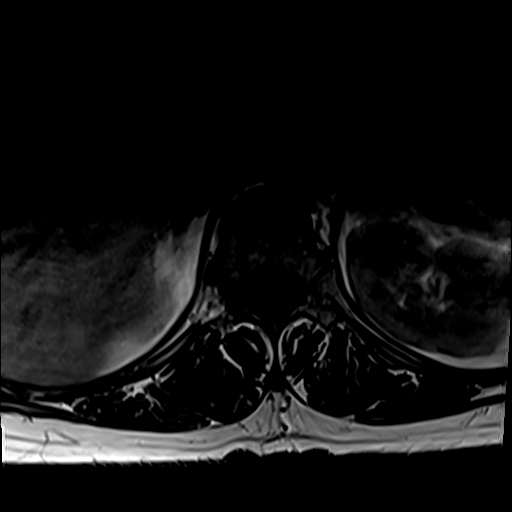

[31 of 48 positions shown; findings below may reference images not displayed]

FINDINGS: Segmentation:  5 lumbar type vertebral bodies.

Alignment: Curvature convex to the right with the apex at L2-3. 3 mm
retrolisthesis L1-2. 6 mm fixed/fused anterolisthesis at L5-S1.

Vertebrae: Discogenic endplate edema on the left at L1-2 and on the
right at L4-5 which could correlate with regional back pain.

Conus medullaris and cauda equina: Conus extends to the L1 level.
Conus and cauda equina appear normal.

Paraspinal and other soft tissues: Negative

Disc levels:

L1-2: Bilateral facet osteoarthritis. 3 mm retrolisthesis. Disc
degeneration with endplate osteophytes and bulging of the disc more
prominent on the left. Mild stenosis of the left lateral recess and
intervertebral foramen on the left. Discogenic endplate edema could
relate to back pain.

L2-3: Mild bulging of the disc. Mild facet and ligamentous
prominence. Mild narrowing of the lateral recesses without
compressive stenosis.

L3-4: Mild bulging of the disc. Mild facet and ligamentous
prominence. Mild stenosis of the lateral recesses without visible
neural compression.

L4-5: Chronic loss of disc height. Endplate osteophytes and bulging
of the disc. Facet degeneration and hypertrophy. No canal stenosis.
Mild foraminal narrowing, not grossly compressive. Discogenic edema
of the endplates which could relate to back pain.

L5-S1: Chronic fused anterolisthesis measuring 6 mm. Central canal
widely patent. Chronic bilateral foraminal narrowing right more than
left, fixed.
IMPRESSION: 1. Curvature convex to the right with the apex at L2-3. 3 mm
retrolisthesis L1-2. 6 mm chronic fixed anterolisthesis at L5-S1.
2. Discogenic endplate edema at L1-2 and L4-5 which could relate to
back pain.
3. No compressive central canal stenosis. Lateral recess and
foraminal narrowing as outlined above. Chronic bilateral foraminal
narrowing at L5-S1 right more than left, fixed.

## 2021-05-10 ENCOUNTER — Other Ambulatory Visit: Payer: Self-pay | Admitting: *Deleted

## 2021-05-10 ENCOUNTER — Encounter: Payer: Self-pay | Admitting: Internal Medicine

## 2021-05-10 MED ORDER — LOSARTAN POTASSIUM-HCTZ 100-25 MG PO TABS: 1.0000 | ORAL_TABLET | Freq: Every day | ORAL | 1 refills | Status: DC

## 2021-06-01 ENCOUNTER — Other Ambulatory Visit: Payer: Self-pay | Admitting: *Deleted

## 2021-06-01 ENCOUNTER — Encounter: Payer: Self-pay | Admitting: Internal Medicine

## 2021-06-01 MED ORDER — VALACYCLOVIR HCL 1 G PO TABS: 1000.0000 mg | ORAL_TABLET | Freq: Every day | ORAL | 0 refills | Status: AC | PRN

## 2021-06-16 ENCOUNTER — Ambulatory Visit (INDEPENDENT_AMBULATORY_CARE_PROVIDER_SITE_OTHER): Payer: Medicare HMO | Admitting: Internal Medicine

## 2021-06-16 ENCOUNTER — Other Ambulatory Visit: Payer: Self-pay

## 2021-06-16 ENCOUNTER — Encounter: Payer: Self-pay | Admitting: Internal Medicine

## 2021-06-16 VITALS — BP 124/86 | HR 71 | Resp 18 | Ht 63.0 in | Wt 174.0 lb

## 2021-06-16 DIAGNOSIS — E039 Hypothyroidism, unspecified: Secondary | ICD-10-CM

## 2021-06-16 DIAGNOSIS — I10 Essential (primary) hypertension: Secondary | ICD-10-CM

## 2021-06-16 DIAGNOSIS — Z23 Encounter for immunization: Secondary | ICD-10-CM | POA: Diagnosis not present

## 2021-06-16 NOTE — Patient Instructions (Signed)
Please continue taking medications as prescribed.  Please continue to follow low salt diet and perform moderate exercise/walking at least 150 mins/week. 

## 2021-06-16 NOTE — Progress Notes (Signed)
Established Patient Office Visit  Subjective:  Patient ID: Crystal Mccarthy, female    DOB: 1956/02/13  Age: 66 y.o. MRN: [id number]  CC:  Chief Complaint  Patient presents with   Follow-up    3 month follow up HTN and hypothyroidism pt has ears itching and dry eyes feels like a cold coming on     HPI Crystal Mccarthy is a 66 y.o. female with past medical history of HTN, migraine, GERD, hypothyroidism, depression with anxiety who presents for f/u of her chronic medical conditions.  BP is well-controlled. Takes medications regularly. Patient denies headache, dizziness, chest pain, dyspnea or palpitations.  She has started taking Levothyroxine instead of NNP thyroid now and has been tolerating it well. She denies any recent change in appetite. Denies any fatigue.  She reports mild nasal congestion and ear itching since yesterday, but has not tried any OTC medication for symptom relief yet.  She denies any fever, chills, dyspnea or wheezing currently. Denies any recent sick contacts. She usually takes OTC decongestant and her symptoms improve. She prefers to try it first for now.  She received PPSV23 in the office today.  Past Medical History:  Diagnosis Date   Atypical mole 06/25/2020   Right Buttock (moderate)   Basal cell carcinoma 1995   nose ( [place])   Depression 05/01/2019   Essential hypertension, benign Yes   Hypothyroidism, adult 05/01/2019   Primary ovarian failure 05/01/2019   Vitamin D deficiency disease 05/01/2019    Past Surgical History:  Procedure Laterality Date   BREAST BIOPSY Right 06/2019   negative   cataract surgery     COLONOSCOPY  2016   [place]; per patient, 1 polyp with recommendations to repeat in 5 years   COLONOSCOPY  2010   [place], per patient, normal exam   COLONOSCOPY WITH PROPOFOL N/A 02/10/2020   Procedure: COLONOSCOPY WITH PROPOFOL;  Surgeon: Daneil Dolin, MD;  [facility];  Service: Endoscopy;   Laterality: N/A;  1:30pm   POLYPECTOMY  02/10/2020   Procedure: POLYPECTOMY;  Surgeon: Daneil Dolin, MD;  [facility];  Service: Endoscopy;;    Family History  Problem Relation Age of Onset   Dementia Mother    Heart disease Father    Parkinson's disease Father    Obesity Sister    Colon cancer Neg Hx     Social History   Socioeconomic History   Marital status: Married    Spouse name: Not on file   Number of children: Not on file   Years of education: Not on file   Highest education level: Not on file  Occupational History   Not on file  Tobacco Use   Smoking status: Never   Smokeless tobacco: Never  Vaping Use   Vaping Use: Never used  Substance and Sexual Activity   Alcohol use: Yes    Comment: occ- special occasions   Drug use: Never   Sexual activity: Yes  Other Topics Concern   Not on file  Social History Narrative   Married for 33 years,lives with husband.Retired.   Social Determinants of Health   Financial Resource Strain: Not on file  Food Insecurity: Not on file  Transportation Needs: Not on file  Physical Activity: Not on file  Stress: Not on file  Social Connections: Not on file  Intimate Partner Violence: Not on file    Outpatient Medications Prior to Visit  Medication Sig Dispense Refill   Ascorbic Acid (VITAMIN C)  1000 MG tablet Take 1,000 mg by mouth daily.     aspirin EC 81 MG tablet Take 81 mg by mouth at bedtime.      buPROPion (WELLBUTRIN XL) 300 MG 24 hr tablet Take 1 tablet (300 mg total) by mouth daily. 90 tablet 1   Cholecalciferol (VITAMIN D-3) 125 MCG (5000 UT) TABS Take 5,000 Units by mouth daily.      Coenzyme Q10 300 MG CAPS Take 300 mg by mouth at bedtime.      eletriptan (RELPAX) 40 MG tablet Take 1 tablet (40 mg total) by mouth every 2 (two) hours as needed for migraine or headache. 18 tablet 1   estradiol (ESTRACE) 0.5 MG tablet Take 1 tablet (0.5 mg total) by mouth daily. 90 tablet 1   estradiol (ESTRACE) 2 MG  tablet Take 1 tablet (2 mg total) by mouth daily. 90 tablet 1   Krill Oil 500 MG CAPS Take 500 mg by mouth daily.     LORazepam (ATIVAN) 0.5 MG tablet TAKE 1 TABLET BY MOUTH ONCE DAILY AS NEEDED FOR ANXIETY 30 tablet 3   losartan-hydrochlorothiazide (HYZAAR) 100-25 MG tablet Take 1 tablet by mouth daily. 90 tablet 1   Magnesium 500 MG TABS Take 500 mg by mouth daily.      Melatonin 3 MG CAPS Take 3 mg by mouth every evening.     pantoprazole (PROTONIX) 40 MG tablet Take 1 tablet (40 mg total) by mouth daily as needed (Heartburn). 30 tablet 3   Probiotic Product (ALIGN) 4 MG CAPS Take 4 mg by mouth daily.     progesterone (PROMETRIUM) 200 MG capsule Take 2 capsules (400 mg total) by mouth daily. 180 capsule 1   Testosterone 1.62 % GEL Place 2.5 mg onto the skin daily. Testosterone 1% cream, apply 1 click (2.5 mg) daily to the labia. 30 g 1   valACYclovir (VALTREX) 1000 MG tablet Take 1 tablet (1,000 mg total) by mouth daily as needed (Fever Blisters). 30 tablet 0   levothyroxine (SYNTHROID) 100 MCG tablet Take 1 tablet (100 mcg total) by mouth daily. 90 tablet 0   No facility-administered medications prior to visit.    Allergies  Allergen Reactions   Montelukast Sodium Hives    ROS Review of Systems  Constitutional:  Negative for chills and fever.  HENT:  Negative for congestion, sinus pressure, sinus pain and sore throat.   Eyes:  Negative for pain and discharge.  Respiratory:  Negative for cough and shortness of breath.   Cardiovascular:  Negative for chest pain and palpitations.  Gastrointestinal:  Negative for abdominal pain, diarrhea, nausea and vomiting.  Endocrine: Negative for polydipsia and polyuria.  Genitourinary:  Negative for dysuria and hematuria.  Musculoskeletal:  Negative for neck pain and neck stiffness.  Skin:  Negative for rash.  Neurological:  Negative for dizziness and weakness.  Psychiatric/Behavioral:  Negative for agitation and behavioral problems.       Objective:    Physical Exam Vitals reviewed.  Constitutional:      General: She is not in acute distress.    Appearance: She is not diaphoretic.  HENT:     Head: Normocephalic and atraumatic.     Nose: Nose normal.     Mouth/Throat:     Mouth: Mucous membranes are moist.  Eyes:     General: No scleral icterus.    Extraocular Movements: Extraocular movements intact.  Cardiovascular:     Rate and Rhythm: Normal rate and regular rhythm.  Pulses: Normal pulses.     Heart sounds: Normal heart sounds. No murmur heard. Pulmonary:     Breath sounds: Normal breath sounds. No wheezing or rales.  Musculoskeletal:     Cervical back: Neck supple. No tenderness.     Right lower leg: No edema.     Left lower leg: No edema.  Skin:    General: Skin is warm.     Findings: No rash.  Neurological:     General: No focal deficit present.     Mental Status: She is alert and oriented to person, place, and time.  Psychiatric:        Mood and Affect: Mood normal.        Behavior: Behavior normal.    BP 124/86 (BP Location: Right Arm, Patient Position: Sitting, Cuff Size: Normal)    Pulse 71    Resp 18    Ht $R'5\' 3"'FE$  (1.6 m)    Wt 174 lb (78.9 kg)    SpO2 97%    BMI 30.82 kg/m  Wt Readings from Last 3 Encounters:  06/16/21 174 lb (78.9 kg)  03/16/21 171 lb 1.9 oz (77.6 kg)  12/31/20 172 lb 9.6 oz (78.3 kg)    Lab Results  Component Value Date   TSH 16.500 (H) 06/16/2021   Lab Results  Component Value Date   WBC 4.8 03/24/2021   HGB 14.4 03/24/2021   HCT 42.6 03/24/2021   MCV 90 03/24/2021   PLT 390 03/24/2021   Lab Results  Component Value Date   NA 139 03/24/2021   K 4.0 03/24/2021   CO2 24 03/24/2021   GLUCOSE 90 03/24/2021   BUN 19 03/24/2021   CREATININE 0.94 03/24/2021   BILITOT 0.3 03/24/2021   ALKPHOS 83 03/24/2021   AST 16 03/24/2021   ALT 11 03/24/2021   PROT 6.6 03/24/2021   ALBUMIN 3.9 03/24/2021   CALCIUM 9.8 03/24/2021   ANIONGAP 7 02/07/2020   EGFR 67  03/24/2021   Lab Results  Component Value Date   CHOL 243 (H) 03/24/2021   Lab Results  Component Value Date   HDL 56 03/24/2021   Lab Results  Component Value Date   LDLCALC 161 (H) 03/24/2021   Lab Results  Component Value Date   TRIG 144 03/24/2021   Lab Results  Component Value Date   CHOLHDL 4.3 03/24/2021   No results found for: HGBA1C    Assessment & Plan:   Problem List Items Addressed This Visit       Cardiovascular and Mediastinum   Essential hypertension, benign    BP Readings from Last 1 Encounters:  06/16/21 124/86  Appears to be overall well-controlled with Losartan-HCTZ Counseled for compliance with the medications Advised DASH diet and moderate exercise/walking, at least 150 mins/week         Endocrine   Hypothyroidism - Primary    Lab Results  Component Value Date   TSH 0.153 (L) 03/24/2021  On Levothyroxine 100 mcg QD now - tolerating it well, was on NP thyroid in the past Check TSH and free T4 today      Relevant Orders   TSH+T4F+T3Free (Completed)   Other Visit Diagnoses     Need for pneumococcal vaccination       Relevant Orders   Pneumococcal polysaccharide vaccine 23-valent greater than or equal to 2yo subcutaneous/IM (Completed)       No orders of the defined types were placed in this encounter.   Follow-up: Return in about 3  months (around 09/14/2021) for Hypothyroidism.    Lindell Spar, MD

## 2021-06-16 NOTE — Assessment & Plan Note (Signed)
Lab Results  Component Value Date   TSH 0.153 (L) 03/24/2021   On Levothyroxine 100 mcg QD now - tolerating it well, was on NP thyroid in the past Check TSH and free T4 today

## 2021-06-17 ENCOUNTER — Other Ambulatory Visit: Payer: Self-pay | Admitting: Internal Medicine

## 2021-06-17 ENCOUNTER — Encounter: Payer: Self-pay | Admitting: Internal Medicine

## 2021-06-17 DIAGNOSIS — E039 Hypothyroidism, unspecified: Secondary | ICD-10-CM

## 2021-06-17 LAB — TSH+T4F+T3FREE
Free T4: 1.4 ng/dL (ref 0.82–1.77)
T3, Free: 1.9 pg/mL — ABNORMAL LOW (ref 2.0–4.4)
TSH: 16.5 u[IU]/mL — ABNORMAL HIGH (ref 0.450–4.500)

## 2021-06-17 MED ORDER — LEVOTHYROXINE SODIUM 125 MCG PO TABS: 125.0000 ug | ORAL_TABLET | Freq: Every day | ORAL | 0 refills | Status: DC

## 2021-06-18 ENCOUNTER — Telehealth: Payer: Self-pay | Admitting: *Deleted

## 2021-06-18 ENCOUNTER — Other Ambulatory Visit: Payer: Self-pay | Admitting: Internal Medicine

## 2021-06-18 DIAGNOSIS — L03119 Cellulitis of unspecified part of limb: Secondary | ICD-10-CM

## 2021-06-18 MED ORDER — CEPHALEXIN 500 MG PO CAPS: 500.0000 mg | ORAL_CAPSULE | Freq: Three times a day (TID) | ORAL | 0 refills | Status: DC

## 2021-06-18 NOTE — Telephone Encounter (Signed)
Pt sent mychart message this am after receiving pneumonia shot yesterday arm is swollen and hot to touch bigger than her hand she wanted to know what to do ask if she could send mychart picture please advise

## 2021-06-18 NOTE — Assessment & Plan Note (Signed)
BP Readings from Last 1 Encounters:  06/16/21 124/86   Appears to be overall well-controlled with Losartan-HCTZ Counseled for compliance with the medications Advised DASH diet and moderate exercise/walking, at least 150 mins/week

## 2021-06-30 ENCOUNTER — Other Ambulatory Visit: Payer: Self-pay

## 2021-06-30 ENCOUNTER — Encounter: Payer: Self-pay | Admitting: Dermatology

## 2021-06-30 ENCOUNTER — Ambulatory Visit: Payer: Medicare HMO | Admitting: Dermatology

## 2021-06-30 DIAGNOSIS — L814 Other melanin hyperpigmentation: Secondary | ICD-10-CM | POA: Diagnosis not present

## 2021-06-30 DIAGNOSIS — L820 Inflamed seborrheic keratosis: Secondary | ICD-10-CM

## 2021-07-06 ENCOUNTER — Encounter (INDEPENDENT_AMBULATORY_CARE_PROVIDER_SITE_OTHER): Payer: Medicare HMO | Admitting: Internal Medicine

## 2021-07-08 ENCOUNTER — Telehealth: Payer: Self-pay

## 2021-07-08 ENCOUNTER — Other Ambulatory Visit: Payer: Self-pay | Admitting: *Deleted

## 2021-07-08 DIAGNOSIS — G43709 Chronic migraine without aura, not intractable, without status migrainosus: Secondary | ICD-10-CM

## 2021-07-08 MED ORDER — ELETRIPTAN HYDROBROMIDE 40 MG PO TABS: 40.0000 mg | ORAL_TABLET | ORAL | 1 refills | Status: DC | PRN

## 2021-07-08 NOTE — Telephone Encounter (Signed)
Patient came by office need med refill  eletriptan (RELPAX) 40 MG tablet   [facility] [address] [place]  [place], [place] [address]  Phone:  [phone]  Fax:  [phone]

## 2021-07-08 NOTE — Telephone Encounter (Signed)
Medication sent to pharmacy  

## 2021-07-15 ENCOUNTER — Ambulatory Visit (INDEPENDENT_AMBULATORY_CARE_PROVIDER_SITE_OTHER): Payer: Medicare HMO

## 2021-07-15 ENCOUNTER — Other Ambulatory Visit: Payer: Self-pay

## 2021-07-15 ENCOUNTER — Telehealth: Payer: Self-pay

## 2021-07-15 DIAGNOSIS — Z0000 Encounter for general adult medical examination without abnormal findings: Secondary | ICD-10-CM | POA: Diagnosis not present

## 2021-07-15 DIAGNOSIS — Z780 Asymptomatic menopausal state: Secondary | ICD-10-CM

## 2021-07-15 NOTE — Progress Notes (Signed)
Subjective:   Crystal Mccarthy is a 66 y.o. female who presents for an Initial Medicare Annual Wellness Visit. I connected with  [name] on 07/15/21 by a audio enabled telemedicine application and verified that I am speaking with the correct person using two identifiers.  Patient Location: Home  Provider Location: Office/Clinic  I discussed the limitations of evaluation and management by telemedicine. The patient expressed understanding and agreed to proceed.  Review of Systems    Defer to PCP Cardiac Risk Factors include: advanced age (>26men, >68 women);hypertension     Objective:    Today's Vitals   07/15/21 1138  PainSc: 0-No pain   There is no height or weight on file to calculate BMI.  Advanced Directives 07/15/2021 03/18/2020 02/07/2020  Does Patient Have a Medical Advance Directive? Yes Yes Yes  Type of Paramedic of [place];Living will - [place];Living will  Does patient want to make changes to medical advance directive? - No - Patient declined -  Copy of [place] in Chart? No - copy requested - No - copy requested    Current Medications (verified) Outpatient Encounter Medications as of 07/15/2021  Medication Sig   Ascorbic Acid (VITAMIN C) 1000 MG tablet Take 1,000 mg by mouth daily.   aspirin EC 81 MG tablet Take 81 mg by mouth at bedtime.    buPROPion (WELLBUTRIN XL) 300 MG 24 hr tablet Take 1 tablet (300 mg total) by mouth daily.   Cholecalciferol (VITAMIN D-3) 125 MCG (5000 UT) TABS Take 5,000 Units by mouth daily.    Coenzyme Q10 300 MG CAPS Take 300 mg by mouth at bedtime.    eletriptan (RELPAX) 40 MG tablet Take 1 tablet (40 mg total) by mouth every 2 (two) hours as needed for migraine or headache.   estradiol (ESTRACE) 0.5 MG tablet Take 1 tablet (0.5 mg total) by mouth daily.   estradiol (ESTRACE) 2 MG tablet Take 1 tablet (2 mg total) by mouth daily.   Krill Oil 500 MG CAPS  Take 500 mg by mouth daily.   levothyroxine (SYNTHROID) 125 MCG tablet Take 1 tablet (125 mcg total) by mouth daily.   LORazepam (ATIVAN) 0.5 MG tablet TAKE 1 TABLET BY MOUTH ONCE DAILY AS NEEDED FOR ANXIETY   losartan-hydrochlorothiazide (HYZAAR) 100-25 MG tablet Take 1 tablet by mouth daily.   Magnesium 500 MG TABS Take 500 mg by mouth daily.    Melatonin 3 MG CAPS Take 3 mg by mouth every evening.   pantoprazole (PROTONIX) 40 MG tablet Take 1 tablet (40 mg total) by mouth daily as needed (Heartburn).   Probiotic Product (ALIGN) 4 MG CAPS Take 4 mg by mouth daily.   progesterone (PROMETRIUM) 200 MG capsule Take 2 capsules (400 mg total) by mouth daily.   Testosterone 1.62 % GEL Place 2.5 mg onto the skin daily. Testosterone 1% cream, apply 1 click (2.5 mg) daily to the labia.   valACYclovir (VALTREX) 1000 MG tablet Take 1 tablet (1,000 mg total) by mouth daily as needed (Fever Blisters).   [DISCONTINUED] cephALEXin (KEFLEX) 500 MG capsule Take 1 capsule (500 mg total) by mouth 3 (three) times daily.   No facility-administered encounter medications on file as of 07/15/2021.    Allergies (verified) Montelukast sodium   History: Past Medical History:  Diagnosis Date   Atypical mole 06/25/2020   Right Buttock (moderate)   Basal cell carcinoma 1995   nose ( [place])   Depression 05/01/2019  Essential hypertension, benign Yes   Hypothyroidism, adult 05/01/2019   Primary ovarian failure 05/01/2019   Vitamin D deficiency disease 05/01/2019   Past Surgical History:  Procedure Laterality Date   BREAST BIOPSY Right 06/2019   negative   cataract surgery     COLONOSCOPY  2016   [place]; per patient, 1 polyp with recommendations to repeat in 5 years   COLONOSCOPY  2010   [place], per patient, normal exam   COLONOSCOPY WITH PROPOFOL N/A 02/10/2020   Procedure: COLONOSCOPY WITH PROPOFOL;  Surgeon: Daneil Dolin, MD;  [facility];  Service: Endoscopy;  Laterality:  N/A;  1:30pm   POLYPECTOMY  02/10/2020   Procedure: POLYPECTOMY;  Surgeon: Daneil Dolin, MD;  Location: [facility];  Service: Endoscopy;;   Family History  Problem Relation Age of Onset   Dementia Mother    Heart disease Father    Parkinson's disease Father    Obesity Sister    Colon cancer Neg Hx    Social History   Socioeconomic History   Marital status: Married    Spouse name: Not on file   Number of children: 3   Years of education: 12   Highest education level: Bachelor's degree (e.g., BA, AB, BS)  Occupational History   Not on file  Tobacco Use   Smoking status: Never   Smokeless tobacco: Never  Vaping Use   Vaping Use: Never used  Substance and Sexual Activity   Alcohol use: Yes    Comment: occ- special occasions   Drug use: Never   Sexual activity: Not Currently  Other Topics Concern   Not on file  Social History Narrative   Married for 56 years,lives with husband.Retired.   Social Determinants of Health   Financial Resource Strain: Low Risk    Difficulty of Paying Living Expenses: Not hard at all  Food Insecurity: No Food Insecurity   Worried About Charity fundraiser in the Last Year: Never true   [place] in the Last Year: Never true  Transportation Needs: No Transportation Needs   Lack of Transportation (Medical): No   Lack of Transportation (Non-Medical): No  Physical Activity: Insufficiently Active   Days of Exercise per Week: 4 days   Minutes of Exercise per Session: 30 min  Stress: No Stress Concern Present   Feeling of Stress : Only a little  Social Connections: Engineer, building services of Communication with Friends and Family: More than three times a week   Frequency of Social Gatherings with Friends and Family: Three times a week   Attends Religious Services: More than 4 times per year   Active Member of Clubs or Organizations: Yes   Attends Music therapist: More than 4 times per year   Marital Status:  Married    Tobacco Counseling Counseling given: Not Answered   Clinical Intake:  Pre-visit preparation completed: No  Pain : No/denies pain Pain Score: 0-No pain     Nutritional Risks: None Diabetes: No  How often do you need to have someone help you when you read instructions, pamphlets, or other written materials from your doctor or pharmacy?: 1 - Never What is the last grade level you completed in school?: 12  Diabetic?no  Interpreter Needed?: No  Information entered by :: [place] of Daily Living In your present state of health, do you have any difficulty performing the following activities: 07/15/2021 03/16/2021  Hearing? N N  Vision? N  N  Difficulty concentrating or making decisions? Y N  Walking or climbing stairs? N N  Dressing or bathing? N N  Doing errands, shopping? N N  Preparing Food and eating ? N -  Using the Toilet? N -  In the past six months, have you accidently leaked urine? N -  Do you have problems with loss of bowel control? N -  Managing your Medications? N -  Managing your Finances? N -  Housekeeping or managing your Housekeeping? N -  Some recent data might be hidden    Patient Care Team: Lindell Spar, MD as PCP - General (Internal Medicine) Gala Romney Cristopher Estimable, MD as Consulting Physician (Gastroenterology) Warren Danes, PA-C as Physician Assistant (Dermatology)  Indicate any recent Medical Services you may have received from other than [facility] providers in the past year (date may be approximate).     Assessment:   This is a routine wellness examination for [place].  Hearing/Vision screen No results found.  Dietary issues and exercise activities discussed: Current Exercise Habits: Home exercise routine, Type of exercise: yoga, Time (Minutes): 30, Frequency (Times/Week): 3, Weekly Exercise (Minutes/Week): 90   Goals Addressed   None   Depression Screen PHQ 2/9 Scores 07/15/2021 06/16/2021 03/16/2021 09/11/2019  PHQ - 2  Score 6 3 0 0  PHQ- 9 Score 12 3 - -  Exception Documentation - - - Medical reason    Fall Risk Fall Risk  07/15/2021 06/16/2021 03/16/2021 09/17/2020 09/11/2019  Falls in the past year? 0 0 0 0 0  Number falls in past yr: 0 0 0 0 0  Injury with Fall? 0 0 0 0 0  Risk for fall due to : No Fall Risks No Fall Risks No Fall Risks No Fall Risks -  Follow up Falls evaluation completed Falls evaluation completed Falls evaluation completed Falls evaluation completed -    FALL RISK PREVENTION PERTAINING TO THE HOME:  Any stairs in or around the home? Yes  If so, are there any without handrails? Yes  Home free of loose throw rugs in walkways, pet beds, electrical cords, etc? Yes  Adequate lighting in your home to reduce risk of falls? Yes   ASSISTIVE DEVICES UTILIZED TO PREVENT FALLS:  Life alert? No  Use of a cane, walker or w/c? No  Grab bars in the bathroom? Yes  Shower chair or bench in shower? Yes  Elevated toilet seat or a handicapped toilet? Yes    Cognitive Function:     6CIT Screen 07/15/2021  What Year? 0 points  What month? 0 points  What time? 0 points  Count back from 20 0 points  Months in reverse 0 points  Repeat phrase 0 points  Total Score 0    Immunizations Immunization History  Administered Date(s) Administered   Fluad Quad(high Dose 65+) 05/01/2019, 03/16/2021   Influenza-Unspecified 04/23/2020   Moderna Sars-Covid-2 Vaccination 08/15/2019, 09/12/2019, 04/23/2020, 12/31/2020   Pneumococcal Conjugate-13 05/01/2019   Pneumococcal Polysaccharide-23 06/16/2021   Tdap 09/10/2012   Zoster Recombinat (Shingrix) 01/08/2020, 06/24/2020    TDAP status: Up to date  Flu Vaccine status: Up to date  Pneumococcal vaccine status: Up to date  Covid-19 vaccine status: Information provided on how to obtain vaccines.   Qualifies for Shingles Vaccine? Yes   Zostavax completed No   Shingrix Completed?: Yes  Screening Tests Health Maintenance  Topic Date Due   COVID-19  Vaccine (5 - Booster for Moderna series) 02/25/2021   MAMMOGRAM  06/24/2021  TETANUS/TDAP  09/11/2022   COLONOSCOPY (Pts 45-67yrs Insurance coverage will need to be confirmed)  02/09/2030   Pneumonia Vaccine 38+ Years old  Completed   INFLUENZA VACCINE  Completed   DEXA SCAN  Completed   Hepatitis C Screening  Completed   Zoster Vaccines- Shingrix  Completed   HPV VACCINES  Aged Out    Health Maintenance  Health Maintenance Due  Topic Date Due   COVID-19 Vaccine (5 - Booster for Moderna series) 02/25/2021   MAMMOGRAM  06/24/2021    Colorectal cancer screening: Type of screening: Colonoscopy. Completed 03/12/2020. Repeat every 10 years  Mammogram status: Ordered pt has it scheduled in March. Pt provided with contact info and advised to call to schedule appt.   Bone Density status: Completed 02/22/2018. Results reflect: Bone density results: OSTEOPENIA. Repeat every 2 years.  Lung Cancer Screening: (Low Dose CT Chest recommended if Age 18-80 years, 30 pack-year currently smoking OR have quit w/in 15years.) does not qualify.   Lung Cancer Screening Referral: n/a  Additional Screening:  Hepatitis C Screening: does qualify; Completed 05/01/2019  Vision Screening: Recommended annual ophthalmology exams for early detection of glaucoma and other disorders of the eye. Is the patient up to date with their annual eye exam?  Yes  Who is the provider or what is the name of the office in which the patient attends annual eye exams? [facility] If pt is not established with a provider, would they like to be referred to a provider to establish care? No .   Dental Screening: Recommended annual dental exams for proper oral hygiene  Community Resource Referral / Chronic Care Management: CRR required this visit?  No   CCM required this visit?  No      Plan:     I have personally reviewed and noted the following in the patients chart:   Medical and social history Use of alcohol,  tobacco or illicit drugs  Current medications and supplements including opioid prescriptions. Patient is not currently taking opioid prescriptions. Functional ability and status Nutritional status Physical activity Advanced directives List of other physicians Hospitalizations, surgeries, and ER visits in previous 12 months Vitals Screenings to include cognitive, depression, and falls Referrals and appointments  In addition, I have reviewed and discussed with patient certain preventive protocols, quality metrics, and best practice recommendations. A written personalized care plan for preventive services as well as general preventive health recommendations were provided to patient.     Earline Mayotte, [place]   07/15/2021   Nurse Notes:  Ms. Fessenden , Thank you for taking time to come for your Medicare Wellness Visit. I appreciate your ongoing commitment to your health goals. Please review the following plan we discussed and let me know if I can assist you in the future.   These are the goals we discussed:  Goals   None     This is a list of the screening recommended for you and due dates:  Health Maintenance  Topic Date Due   COVID-19 Vaccine (5 - Booster for Moderna series) 02/25/2021   Mammogram  06/24/2021   Tetanus Vaccine  09/11/2022   Colon Cancer Screening  02/09/2030   Pneumonia Vaccine  Completed   Flu Shot  Completed   DEXA scan (bone density measurement)  Completed   Hepatitis C Screening: USPSTF Recommendation to screen - Ages 41-79 yo.  Completed   Zoster (Shingles) Vaccine  Completed   HPV Vaccine  Aged Out

## 2021-07-15 NOTE — Patient Instructions (Signed)

## 2021-07-15 NOTE — Telephone Encounter (Signed)
Pt would like to know what vaccinations she had when she went to [place] and also wants to know if it is time to get any boosters for these vaccines

## 2021-07-23 ENCOUNTER — Encounter: Payer: Self-pay | Admitting: Dermatology

## 2021-07-23 NOTE — Progress Notes (Signed)
° °  Follow-Up Visit   Subjective  Crystal Mccarthy is a 66 y.o. female who presents for the following: Follow-up (Patient here today to have LN2 on face for Sk's patient was unable to have this treatment done in the summer time due to it being best to do treatment in the winter. ).  Discolored spots on face pulmonary irritation    I, Crystal Monarch, MD, have reviewed all documentation for this visit.  The documentation on 07/23/21 for the exam, diagnosis, procedures, and orders are all accurate and complete. to irritation Location:  Duration:  Quality:  Associated Signs/Symptoms: Modifying Factors:  Severity:  Timing: Context:   Objective  Well appearing patient in no apparent distress; mood and affect are within normal limits. Left Buccal Cheek x5 right cheek x 7 (12) Multiple irritated keratoses  Right Buccal Cheek Symmetric macular light brown 6 mm with no dermoscopic atypia    A focused examination was performed including head and neck. Relevant physical exam findings are noted in the Assessment and Plan.   Assessment & Plan    Inflamed seborrheic keratosis (12) Left Buccal Cheek x5 right cheek x 7  Isk and solar lentigo Left cheek x 4, right cheek x 7  Destruction of lesion - Left Buccal Cheek x5 right cheek x 7 Complexity: simple   Destruction method: cryotherapy   Informed consent: discussed and consent obtained   Timeout:  patient name, date of birth, surgical site, and procedure verified Lesion destroyed using liquid nitrogen: Yes   Cryotherapy cycles:  3 Outcome: patient tolerated procedure well with no complications   Post-procedure details: wound care instructions given    Lentigines Right Buccal Cheek  No intervention initiated      I, Crystal Monarch, MD, have reviewed all documentation for this visit.  The documentation on 07/23/21 for the exam, diagnosis, procedures, and orders are all accurate and complete.

## 2021-08-04 ENCOUNTER — Encounter: Payer: Self-pay | Admitting: Internal Medicine

## 2021-08-04 ENCOUNTER — Other Ambulatory Visit: Payer: Self-pay | Admitting: Internal Medicine

## 2021-08-04 DIAGNOSIS — F411 Generalized anxiety disorder: Secondary | ICD-10-CM

## 2021-08-04 MED ORDER — LORAZEPAM 0.5 MG PO TABS: ORAL_TABLET | ORAL | 3 refills | Status: DC

## 2021-08-06 ENCOUNTER — Encounter: Payer: Self-pay | Admitting: Internal Medicine

## 2021-08-06 NOTE — Telephone Encounter (Signed)
Spoke with [name] advised of e visit option and walked her through process since her husband isn't our pt just yet. She is going to do that but is concerned in regards to her husband Crystal Mccarthy's appt for Monday since he is positive. Please advise.

## 2021-08-07 ENCOUNTER — Encounter: Payer: Self-pay | Admitting: Physician Assistant

## 2021-08-07 ENCOUNTER — Telehealth: Payer: Medicare HMO | Admitting: Physician Assistant

## 2021-08-07 ENCOUNTER — Encounter: Payer: Self-pay | Admitting: Internal Medicine

## 2021-08-07 DIAGNOSIS — U071 COVID-19: Secondary | ICD-10-CM

## 2021-08-07 MED ORDER — BENZONATATE 100 MG PO CAPS: 100.0000 mg | ORAL_CAPSULE | Freq: Two times a day (BID) | ORAL | 0 refills | Status: DC | PRN

## 2021-08-07 MED ORDER — NIRMATRELVIR/RITONAVIR (PAXLOVID)TABLET: 3.0000 | ORAL_TABLET | Freq: Two times a day (BID) | ORAL | 0 refills | Status: AC

## 2021-08-07 NOTE — Progress Notes (Signed)
Virtual Visit Consent   Crystal Mccarthy, you are scheduled for a virtual visit with a [facility] provider today.     Just as with appointments in the office, your consent must be obtained to participate.  Your consent will be active for this visit and any virtual visit you may have with one of our providers in the next 365 days.     If you have a MyChart account, a copy of this consent can be sent to you electronically.  All virtual visits are billed to your insurance company just like a traditional visit in the office.    As this is a virtual visit, video technology does not allow for your provider to perform a traditional examination.  This may limit your provider's ability to fully assess your condition.  If your provider identifies any concerns that need to be evaluated in person or the need to arrange testing (such as labs, EKG, etc.), we will make arrangements to do so.     Although advances in technology are sophisticated, we cannot ensure that it will always work on either your end or our end.  If the connection with a video visit is poor, the visit may have to be switched to a telephone visit.  With either a video or telephone visit, we are not always able to ensure that we have a secure connection.     Patient was unable to join through video, several were made visit was continued through telephone.  I need to obtain your verbal consent now.   Are you willing to proceed with your visit today?    Crystal Mccarthy has provided verbal consent on 08/07/2021 for a virtual visit (video or telephone).   Crystal Rad, PA-C   Date: 08/07/2021 11:28 AM   Virtual Visit via Video Note   I, Crystal Mccarthy, connected with  Crystal Mccarthy  ([id number], 1955-10-06) on 08/07/21 at 11:00 AM EST by a video-enabled telemedicine application and verified that I am speaking with the correct person using two identifiers.  Location: Patient: Virtual Visit Location Patient: Home Provider:  Virtual Visit Location Provider: Home Office   I discussed the limitations of evaluation and management by telemedicine and the availability of in person appointments. The patient expressed understanding and agreed to proceed.    History of Present Illness: Crystal Mccarthy is a 66 y.o. who identifies as a female who was assigned female at birth, states that she started having a dry cough Thursday evening, 3 days ago, states that she has been experiencing a measured temperature of 99.9, 100.2, runny nose with clear discharge, excessive sneezing, soreness in her chest, overall body aches, and headaches.  Reports that she did test positive for COVID via home test.  Reports husband is also positive for COVID.  Reports recent airplane travel to [place].  Reports that her son is a physician associate, states that he recommended Tylenol and ibuprofen along with Mucinex, which she says has offered modest relief.  Denies nausea, vomiting, diarrhea, shortness of breath or wheezing.  States that she is eating and drinking okay.  States that she did have a good nights rest last night.  Does endorse previous COVID vaccines with 2 boosters, last booster in July 2022.   HPI: HPI  Problems:  Patient Active Problem List   Diagnosis Date Noted   GERD (gastroesophageal reflux disease) 03/16/2021   Mixed hyperlipidemia 03/16/2021   History of colonic polyps 10/30/2019   Migraine 06/20/2019  Hypothyroidism 05/01/2019   Vitamin D deficiency disease 05/01/2019   Essential hypertension, benign 05/01/2019   Primary ovarian failure 05/01/2019   Depression 05/01/2019    Allergies:  Allergies  Allergen Reactions   Montelukast Sodium Hives   Medications:  Current Outpatient Medications:    benzonatate (TESSALON) 100 MG capsule, Take 1 capsule (100 mg total) by mouth 2 (two) times daily as needed for cough., Disp: 20 capsule, Rfl: 0   nirmatrelvir/ritonavir EUA (PAXLOVID) 20 x 150 MG & 10 x 100MG   TABS, Take 3 tablets by mouth 2 (two) times daily for 5 days. (Take nirmatrelvir 150 mg two tablets twice daily for 5 days and ritonavir 100 mg one tablet twice daily for 5 days) Patient GFR is 67, Disp: 30 tablet, Rfl: 0   Ascorbic Acid (VITAMIN C) 1000 MG tablet, Take 1,000 mg by mouth daily., Disp: , Rfl:    aspirin EC 81 MG tablet, Take 81 mg by mouth at bedtime. , Disp: , Rfl:    buPROPion (WELLBUTRIN XL) 300 MG 24 hr tablet, Take 1 tablet (300 mg total) by mouth daily., Disp: 90 tablet, Rfl: 1   Cholecalciferol (VITAMIN D-3) 125 MCG (5000 UT) TABS, Take 5,000 Units by mouth daily. , Disp: , Rfl:    Coenzyme Q10 300 MG CAPS, Take 300 mg by mouth at bedtime. , Disp: , Rfl:    eletriptan (RELPAX) 40 MG tablet, Take 1 tablet (40 mg total) by mouth every 2 (two) hours as needed for migraine or headache., Disp: 18 tablet, Rfl: 1   estradiol (ESTRACE) 0.5 MG tablet, Take 1 tablet (0.5 mg total) by mouth daily., Disp: 90 tablet, Rfl: 1   estradiol (ESTRACE) 2 MG tablet, Take 1 tablet (2 mg total) by mouth daily., Disp: 90 tablet, Rfl: 1   Krill Oil 500 MG CAPS, Take 500 mg by mouth daily., Disp: , Rfl:    levothyroxine (SYNTHROID) 125 MCG tablet, Take 1 tablet (125 mcg total) by mouth daily., Disp: 90 tablet, Rfl: 0   LORazepam (ATIVAN) 0.5 MG tablet, TAKE 1 TABLET BY MOUTH ONCE DAILY AS NEEDED FOR ANXIETY, Disp: 30 tablet, Rfl: 3   losartan-hydrochlorothiazide (HYZAAR) 100-25 MG tablet, Take 1 tablet by mouth daily., Disp: 90 tablet, Rfl: 1   Magnesium 500 MG TABS, Take 500 mg by mouth daily. , Disp: , Rfl:    Melatonin 3 MG CAPS, Take 3 mg by mouth every evening., Disp: , Rfl:    pantoprazole (PROTONIX) 40 MG tablet, Take 1 tablet (40 mg total) by mouth daily as needed (Heartburn)., Disp: 30 tablet, Rfl: 3   Probiotic Product (ALIGN) 4 MG CAPS, Take 4 mg by mouth daily., Disp: , Rfl:    progesterone (PROMETRIUM) 200 MG capsule, Take 2 capsules (400 mg total) by mouth daily., Disp: 180 capsule, Rfl:  1   Testosterone 1.62 % GEL, Place 2.5 mg onto the skin daily. Testosterone 1% cream, apply 1 click (2.5 mg) daily to the labia., Disp: 30 g, Rfl: 1   valACYclovir (VALTREX) 1000 MG tablet, Take 1 tablet (1,000 mg total) by mouth daily as needed (Fever Blisters)., Disp: 30 tablet, Rfl: 0  Observations/Objective: Medical history and current medications reviewed, no physical exam completed  Assessment and Plan: 1. COVID-19 - nirmatrelvir/ritonavir EUA (PAXLOVID) 20 x 150 MG & 10 x 100MG  TABS; Take 3 tablets by mouth 2 (two) times daily for 5 days. (Take nirmatrelvir 150 mg two tablets twice daily for 5 days and ritonavir 100 mg one tablet twice  daily for 5 days) Patient GFR is 67  Dispense: 30 tablet; Refill: 0 - benzonatate (TESSALON) 100 MG capsule; Take 1 capsule (100 mg total) by mouth 2 (two) times daily as needed for cough.  Dispense: 20 capsule; Refill: 0  Patient GFR in October 2022 was 67.  Patient education given on supportive care, red flags given for prompt reevaluation.  Proper isolation guidelines discussed with patient.  Patient understands and agrees  Follow Up Instructions: I discussed the assessment and treatment plan with the patient. The patient was provided an opportunity to ask questions and all were answered. The patient agreed with the plan and demonstrated an understanding of the instructions.  A copy of instructions were sent to the patient via MyChart unless otherwise noted below.    The patient was advised to call back or seek an in-person evaluation if the symptoms worsen or if the condition fails to improve as anticipated.  Time:  I spent 20 minutes with the patient via telehealth technology discussing the above problems/concerns.    Loraine Grip Mayers, PA-C

## 2021-08-07 NOTE — Patient Instructions (Addendum)
Crystal Mccarthy, thank you for joining Kennieth Rad, PA-C for today's virtual visit.  While this provider is not your primary care provider (PCP), if your PCP is located in our provider database this encounter information will be shared with them immediately following your visit.  Consent: (Patient) Crystal Mccarthy provided verbal consent for this virtual visit at the beginning of the encounter.  Current Medications:  Current Outpatient Medications:    benzonatate (TESSALON) 100 MG capsule, Take 1 capsule (100 mg total) by mouth 2 (two) times daily as needed for cough., Disp: 20 capsule, Rfl: 0   nirmatrelvir/ritonavir EUA (PAXLOVID) 20 x 150 MG & 10 x 100MG  TABS, Take 3 tablets by mouth 2 (two) times daily for 5 days. (Take nirmatrelvir 150 mg two tablets twice daily for 5 days and ritonavir 100 mg one tablet twice daily for 5 days) Patient GFR is 67, Disp: 30 tablet, Rfl: 0   Ascorbic Acid (VITAMIN C) 1000 MG tablet, Take 1,000 mg by mouth daily., Disp: , Rfl:    aspirin EC 81 MG tablet, Take 81 mg by mouth at bedtime. , Disp: , Rfl:    buPROPion (WELLBUTRIN XL) 300 MG 24 hr tablet, Take 1 tablet (300 mg total) by mouth daily., Disp: 90 tablet, Rfl: 1   Cholecalciferol (VITAMIN D-3) 125 MCG (5000 UT) TABS, Take 5,000 Units by mouth daily. , Disp: , Rfl:    Coenzyme Q10 300 MG CAPS, Take 300 mg by mouth at bedtime. , Disp: , Rfl:    eletriptan (RELPAX) 40 MG tablet, Take 1 tablet (40 mg total) by mouth every 2 (two) hours as needed for migraine or headache., Disp: 18 tablet, Rfl: 1   estradiol (ESTRACE) 0.5 MG tablet, Take 1 tablet (0.5 mg total) by mouth daily., Disp: 90 tablet, Rfl: 1   estradiol (ESTRACE) 2 MG tablet, Take 1 tablet (2 mg total) by mouth daily., Disp: 90 tablet, Rfl: 1   Krill Oil 500 MG CAPS, Take 500 mg by mouth daily., Disp: , Rfl:    levothyroxine (SYNTHROID) 125 MCG tablet, Take 1 tablet (125 mcg total) by mouth daily., Disp: 90 tablet, Rfl: 0   LORazepam  (ATIVAN) 0.5 MG tablet, TAKE 1 TABLET BY MOUTH ONCE DAILY AS NEEDED FOR ANXIETY, Disp: 30 tablet, Rfl: 3   losartan-hydrochlorothiazide (HYZAAR) 100-25 MG tablet, Take 1 tablet by mouth daily., Disp: 90 tablet, Rfl: 1   Magnesium 500 MG TABS, Take 500 mg by mouth daily. , Disp: , Rfl:    Melatonin 3 MG CAPS, Take 3 mg by mouth every evening., Disp: , Rfl:    pantoprazole (PROTONIX) 40 MG tablet, Take 1 tablet (40 mg total) by mouth daily as needed (Heartburn)., Disp: 30 tablet, Rfl: 3   Probiotic Product (ALIGN) 4 MG CAPS, Take 4 mg by mouth daily., Disp: , Rfl:    progesterone (PROMETRIUM) 200 MG capsule, Take 2 capsules (400 mg total) by mouth daily., Disp: 180 capsule, Rfl: 1   Testosterone 1.62 % GEL, Place 2.5 mg onto the skin daily. Testosterone 1% cream, apply 1 click (2.5 mg) daily to the labia., Disp: 30 g, Rfl: 1   valACYclovir (VALTREX) 1000 MG tablet, Take 1 tablet (1,000 mg total) by mouth daily as needed (Fever Blisters)., Disp: 30 tablet, Rfl: 0   Medications ordered in this encounter:  Meds ordered this encounter  Medications   nirmatrelvir/ritonavir EUA (PAXLOVID) 20 x 150 MG & 10 x 100MG  TABS    Sig: Take 3 tablets  by mouth 2 (two) times daily for 5 days. (Take nirmatrelvir 150 mg two tablets twice daily for 5 days and ritonavir 100 mg one tablet twice daily for 5 days) Patient GFR is 67    Dispense:  30 tablet    Refill:  0    Order Specific Question:   Supervising Provider    Answer:   Sabra Heck, BRIAN [[id number]]   benzonatate (TESSALON) 100 MG capsule    Sig: Take 1 capsule (100 mg total) by mouth 2 (two) times daily as needed for cough.    Dispense:  20 capsule    Refill:  0    Order Specific Question:   Supervising Provider    Answer:   Sabra Heck, Sequoyah     *If you need refills on other medications prior to your next appointment, please contact your pharmacy*  Follow-Up: Call back or seek an in-person evaluation if the symptoms worsen or if the condition fails to  improve as anticipated.  Other Instructions I encourage you to make sure you are getting plenty of rest, drinking lots of fluids, okay to continue taking Tylenol or ibuprofen and Mucinex as needed.  I hope that you feel better soon, please let us know if there is anything else we can do for you   If you have been instructed to have an in-person evaluation today at a local Urgent Care facility, please use the link below. It will take you to a list of all of our available [facility], including address, phone number and hours of operation. Please do not delay care.  [facility]  If you or a family member do not have a primary care provider, use the link below to schedule a visit and establish care. When you choose a [facility] primary care physician or advanced practice provider, you gain a long-term partner in health. Find a Primary Care Provider  Learn more about [facility]'s in-office and virtual care options: [facility]  Information on my medicine - Paxlovid (nirmatrelvir/ritonavir) This medication education was reviewed with me or my healthcare representative as part of my discharge preparation.  Why was PAXLOVID prescribed for you? It treats mild to moderate COVID-19.  It may help people who are at high risk of developing severe illness.  The medication works by limiting the spread of the virus in your body.  The FDA has allowed emergency use of this medication.  What do You need to know about PAXLOVID ? Take with or without food.  If it upsets your stomach take with food. This drug comes in a blister card that has both the morning and evening dose in it.  Be sure you know how many tablets to take for each dose and which tablets go together.  If you take this drug the wrong way, it may not work as well or you could have more side effects.  Do not take tablets out of the blister card until you are ready to take your dose. Do not take this drug  for longer than you were told by your doctor or pharmacist. This drug interacts with many other drugs.  Check with your doctor or pharmacist to make sure that it is safe for you to add new medications including OTC, natural products and vitamins while you are completing your course of Paxlovid. After getting this drug, you must continue to isolate and do other things to prevent spreading infection to others.  Wear  a mask, social distance, do not share personal items, clean and disinfect high touch surfaces, and wash hands often. Tell your doctor if you are pregnant, plan on getting pregnant, or are breast-feeding.  You will need to talk about benefits and risks to you and your baby. Birth control pills and other hormone-based birth control may not work as well to prevent pregnancy.  Use some other kind of birth control also like a condom when taking this drug.  What do you do if you miss a dose? If you miss a dose, take it as soon as you remember unless it is more than 8 hours late.  If it is more than 8 hours late, skip the missed dose.  Take the next dose at the normal time.  DO NOT TAKE EXTRA or 2 doses at the same time to make up the missed dose.  Important Safety Information Side effects that may happen but usually do not require medical attention - report if they continue:  Change in taste, diarrhea, muscle pain, stomach pain or nausea. Tell your doctor or get medical help right away if you have any of the following symptoms: Signs of allergic reaction, like rash, hives, itching, red, swollen, blistered, or peeling skin with or without fever, tightness of the chest or throat, trouble breathing. Signs of liver problems like dark urine, tiredness, decreased appetite, light colored stools, throwing up or yellowing of skin or eyes. Sign of high blood pressure like very bad headache or dizziness, passing out, eyesight changes. This website has more information on Paxlovid (nirmatrelvir &  ritonavir):    [email or web address]

## 2021-08-09 ENCOUNTER — Encounter: Payer: Self-pay | Admitting: Internal Medicine

## 2021-08-09 NOTE — Telephone Encounter (Signed)
Pt appt moved to 08-19-21

## 2021-08-09 NOTE — Telephone Encounter (Signed)
Left voice mail to call patient to schedule an virtual visit.  Sent mychart message also.

## 2021-08-10 ENCOUNTER — Other Ambulatory Visit (HOSPITAL_COMMUNITY): Payer: Medicare HMO

## 2021-08-18 ENCOUNTER — Other Ambulatory Visit: Payer: Self-pay

## 2021-08-18 ENCOUNTER — Encounter: Payer: Self-pay | Admitting: Internal Medicine

## 2021-08-18 ENCOUNTER — Ambulatory Visit (HOSPITAL_COMMUNITY)
Admission: RE | Admit: 2021-08-18 | Discharge: 2021-08-18 | Disposition: A | Discharge status: Home / Self Care | Payer: Medicare HMO | Source: Ambulatory Visit | Attending: Internal Medicine | Admitting: Internal Medicine

## 2021-08-18 DIAGNOSIS — M85852 Other specified disorders of bone density and structure, left thigh: Secondary | ICD-10-CM | POA: Diagnosis not present

## 2021-08-18 DIAGNOSIS — Z780 Asymptomatic menopausal state: Secondary | ICD-10-CM | POA: Diagnosis not present

## 2021-08-30 DIAGNOSIS — Z1231 Encounter for screening mammogram for malignant neoplasm of breast: Secondary | ICD-10-CM | POA: Diagnosis not present

## 2021-08-30 DIAGNOSIS — N959 Unspecified menopausal and perimenopausal disorder: Secondary | ICD-10-CM | POA: Diagnosis not present

## 2021-08-30 DIAGNOSIS — N951 Menopausal and female climacteric states: Secondary | ICD-10-CM | POA: Diagnosis not present

## 2021-08-30 DIAGNOSIS — Z01419 Encounter for gynecological examination (general) (routine) without abnormal findings: Secondary | ICD-10-CM | POA: Diagnosis not present

## 2021-08-30 LAB — HM MAMMOGRAPHY

## 2021-09-13 ENCOUNTER — Ambulatory Visit: Payer: Medicare HMO | Admitting: Dermatology

## 2021-09-13 ENCOUNTER — Encounter: Payer: Self-pay | Admitting: Dermatology

## 2021-09-13 DIAGNOSIS — L820 Inflamed seborrheic keratosis: Secondary | ICD-10-CM | POA: Diagnosis not present

## 2021-09-13 DIAGNOSIS — L570 Actinic keratosis: Secondary | ICD-10-CM | POA: Diagnosis not present

## 2021-09-14 ENCOUNTER — Other Ambulatory Visit: Payer: Self-pay | Admitting: Internal Medicine

## 2021-09-14 DIAGNOSIS — E039 Hypothyroidism, unspecified: Secondary | ICD-10-CM

## 2021-09-15 ENCOUNTER — Encounter: Payer: Self-pay | Admitting: Internal Medicine

## 2021-09-20 ENCOUNTER — Ambulatory Visit (INDEPENDENT_AMBULATORY_CARE_PROVIDER_SITE_OTHER): Payer: Medicare HMO | Admitting: Internal Medicine

## 2021-09-20 ENCOUNTER — Encounter: Payer: Self-pay | Admitting: Internal Medicine

## 2021-09-20 VITALS — BP 122/88 | HR 72 | Resp 16 | Ht 62.5 in | Wt 170.6 lb

## 2021-09-20 DIAGNOSIS — F419 Anxiety disorder, unspecified: Secondary | ICD-10-CM

## 2021-09-20 DIAGNOSIS — F3341 Major depressive disorder, recurrent, in partial remission: Secondary | ICD-10-CM | POA: Diagnosis not present

## 2021-09-20 DIAGNOSIS — K219 Gastro-esophageal reflux disease without esophagitis: Secondary | ICD-10-CM

## 2021-09-20 DIAGNOSIS — I10 Essential (primary) hypertension: Secondary | ICD-10-CM

## 2021-09-20 DIAGNOSIS — E039 Hypothyroidism, unspecified: Secondary | ICD-10-CM | POA: Diagnosis not present

## 2021-09-20 DIAGNOSIS — R69 Illness, unspecified: Secondary | ICD-10-CM | POA: Diagnosis not present

## 2021-09-20 NOTE — Progress Notes (Signed)
? ?Established Patient Office Visit ? ?Subjective:  ?Patient ID: Crystal Mccarthy, female    DOB: [birth year]  Age: 66 y.o. MRN: [id number] ? ?CC:  ?Chief Complaint  ?Patient presents with  ? Follow-up  ?  3 month follow up  ? ? ?HPI ?Crystal Mccarthy is a 66 y.o. female with past medical history of HTN, migraine, GERD, hypothyroidism, depression with anxiety who presents for f/u of her chronic medical conditions. ? ?BP is well-controlled. Takes medications regularly. Patient denies headache, dizziness, chest pain, dyspnea or palpitations. ?  ?She has been taking Levothyroxine instead of NNP thyroid now and has been tolerating it well. She denies any recent change in appetite. Denies any fatigue. ? ? ? ? ? ?Past Medical History:  ?Diagnosis Date  ? Atypical mole 06/25/2020  ? Right Buttock (moderate)  ? Basal cell carcinoma 1995  ? nose ( [place])  ? Depression 05/01/2019  ? Essential hypertension, benign Yes  ? Hypothyroidism, adult 05/01/2019  ? 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 Vitamin D deficiency disease 05/01/2019  ? ? ?Past Surgical History:  ?Procedure Laterality Date  ? BREAST BIOPSY Right 06/2019  ? negative  ? cataract surgery    ? COLONOSCOPY  2016  ? [place]; per patient, 1 polyp with recommendations to repeat in 5 years  ? COLONOSCOPY  2010  ? [place], per patient, normal exam  ? COLONOSCOPY WITH PROPOFOL N/A 02/10/2020  ? Procedure: COLONOSCOPY WITH PROPOFOL;  Surgeon: Daneil Dolin, MD;  [facility];  Service: Endoscopy;  Laterality: N/A;  1:30pm  ? POLYPECTOMY  02/10/2020  ? Procedure: POLYPECTOMY;  Surgeon: Daneil Dolin, MD;  Location: [facility];  Service: Endoscopy;;  ? ? ?Family History  ?Problem Relation Age of Onset  ? Dementia Mother   ? Heart disease Father   ? Parkinson's disease Father   ? Obesity Sister   ? Colon cancer Neg Hx   ? ? ?Social History  ? ?Socioeconomic History  ? Marital status: Married  ?  Spouse name: Not on file  ? Number of children:  3  ? Years of education: 65  ? Highest education level: Bachelor's degree (e.g., BA, AB, BS)  ?Occupational History  ? Not on file  ?Tobacco Use  ? Smoking status: Never  ? Smokeless tobacco: Never  ?Vaping Use  ? Vaping Use: Never used  ?Substance and Sexual Activity  ? Alcohol use: Yes  ?  Comment: occ- special occasions  ? Drug use: Never  ? Sexual activity: Not Currently  ?Other Topics Concern  ? Not on file  ?Social History Narrative  ? Married for 43 years,lives with husband.Retired.  ? ?Social Determinants of Health  ? ?Financial Resource Strain: Low Risk   ? Difficulty of Paying Living Expenses: Not hard at all  ?Food Insecurity: No Food Insecurity  ? Worried About Charity fundraiser in the Last Year: Never true  ? Ran Out of Food in the Last Year: Never true  ?Transportation Needs: No Transportation Needs  ? Lack of Transportation (Medical): No  ? Lack of Transportation (Non-Medical): No  ?Physical Activity: Insufficiently Active  ? Days of Exercise per Week: 4 days  ? Minutes of Exercise per Session: 30 min  ?Stress: No Stress Concern Present  ? Feeling of Stress : Only a little  ?Social Connections: Socially Integrated  ? Frequency of Communication with Friends and Family: More than three times a week  ? Frequency of Social Gatherings with Friends  and Family: Three times a week  ? Attends Religious Services: More than 4 times per year  ? Active Member of Clubs or Organizations: Yes  ? Attends Archivist Meetings: More than 4 times per year  ? Marital Status: Married  ?Intimate Partner Violence: Not At Risk  ? Fear of Current or Ex-Partner: No  ? Emotionally Abused: No  ? Physically Abused: No  ? Sexually Abused: No  ? ? ?Outpatient Medications Prior to Visit  ?Medication Sig Dispense Refill  ? Ascorbic Acid (VITAMIN C) 1000 MG tablet Take 1,000 mg by mouth daily.    ? aspirin EC 81 MG tablet Take 81 mg by mouth at bedtime.     ? benzonatate (TESSALON) 100 MG capsule Take 1 capsule (100 mg  total) by mouth 2 (two) times daily as needed for cough. 20 capsule 0  ? buPROPion (WELLBUTRIN XL) 300 MG 24 hr tablet Take 1 tablet (300 mg total) by mouth daily. 90 tablet 1  ? Cholecalciferol (VITAMIN D-3) 125 MCG (5000 UT) TABS Take 5,000 Units by mouth daily.     ? Coenzyme Q10 300 MG CAPS Take 300 mg by mouth at bedtime.     ? eletriptan (RELPAX) 40 MG tablet Take 1 tablet (40 mg total) by mouth every 2 (two) hours as needed for migraine or headache. 18 tablet 1  ? estradiol (ESTRACE) 1 MG tablet Take 1.5 mg by mouth daily.    ? Krill Oil 500 MG CAPS Take 500 mg by mouth daily.    ? levothyroxine (SYNTHROID) 125 MCG tablet Take 1 tablet by mouth once daily 90 tablet 0  ? LORazepam (ATIVAN) 0.5 MG tablet TAKE 1 TABLET BY MOUTH ONCE DAILY AS NEEDED FOR ANXIETY 30 tablet 3  ? losartan-hydrochlorothiazide (HYZAAR) 100-25 MG tablet Take 1 tablet by mouth daily. 90 tablet 1  ? Magnesium 500 MG TABS Take 500 mg by mouth daily.     ? Melatonin 3 MG CAPS Take 3 mg by mouth every evening.    ? pantoprazole (PROTONIX) 40 MG tablet Take 1 tablet (40 mg total) by mouth daily as needed (Heartburn). 30 tablet 3  ? Probiotic Product (ALIGN) 4 MG CAPS Take 4 mg by mouth daily.    ? progesterone (PROMETRIUM) 200 MG capsule Take 2 capsules (400 mg total) by mouth daily. 180 capsule 1  ? Testosterone 1.62 % GEL Place 2.5 mg onto the skin daily. Testosterone 1% cream, apply 1 click (2.5 mg) daily to the labia. 30 g 1  ? valACYclovir (VALTREX) 1000 MG tablet Take 1 tablet (1,000 mg total) by mouth daily as needed (Fever Blisters). 30 tablet 0  ? estradiol (ESTRACE) 0.5 MG tablet Take 1 tablet (0.5 mg total) by mouth daily. 90 tablet 1  ? estradiol (ESTRACE) 2 MG tablet Take 1 tablet (2 mg total) by mouth daily. (Patient not taking: Reported on 09/20/2021) 90 tablet 1  ? ?No facility-administered medications prior to visit.  ? ? ?Allergies  ?Allergen Reactions  ? Montelukast Sodium Hives  ? ? ?ROS ?Review of Systems  ?Constitutional:   Negative for chills and fever.  ?HENT:  Negative for congestion, sinus pressure, sinus pain and sore throat.   ?Eyes:  Negative for pain and discharge.  ?Respiratory:  Negative for cough and shortness of breath.   ?Cardiovascular:  Negative for chest pain and palpitations.  ?Gastrointestinal:  Negative for abdominal pain, diarrhea, nausea and vomiting.  ?Endocrine: Negative for polydipsia and polyuria.  ?Genitourinary:  Negative  for dysuria and hematuria.  ?Musculoskeletal:  Negative for neck pain and neck stiffness.  ?Skin:  Negative for rash.  ?Neurological:  Negative for dizziness and weakness.  ?Psychiatric/Behavioral:  Negative for agitation and behavioral problems.   ? ?  ?Objective:  ?  ?Physical Exam ?Vitals reviewed.  ?Constitutional:   ?   General: She is not in acute distress. ?   Appearance: She is not diaphoretic.  ?HENT:  ?   Head: Normocephalic and atraumatic.  ?   Nose: Nose normal.  ?   Mouth/Throat:  ?   Mouth: Mucous membranes are moist.  ?Eyes:  ?   General: No scleral icterus. ?   Extraocular Movements: Extraocular movements intact.  ?Cardiovascular:  ?   Rate and Rhythm: Normal rate and regular rhythm.  ?   Pulses: Normal pulses.  ?   Heart sounds: Normal heart sounds. No murmur heard. ?Pulmonary:  ?   Breath sounds: Normal breath sounds. No wheezing or rales.  ?Musculoskeletal:  ?   Cervical back: Neck supple. No tenderness.  ?   Right lower leg: No edema.  ?   Left lower leg: No edema.  ?Skin: ?   General: Skin is warm.  ?   Findings: No rash.  ?Neurological:  ?   General: No focal deficit present.  ?   Mental Status: She is alert and oriented to person, place, and time.  ?Psychiatric:     ?   Mood and Affect: Mood normal.     ?   Behavior: Behavior normal.  ? ? ?BP 122/88 (BP Location: Left Arm, Patient Position: Sitting, Cuff Size: Normal)   Pulse 72   Resp 16   Ht 5' 2.5" (1.588 m)   Wt 170 lb 9.6 oz (77.4 kg)   SpO2 98%   BMI 30.71 kg/m?  ?Wt Readings from Last 3 Encounters:   ?09/20/21 170 lb 9.6 oz (77.4 kg)  ?06/16/21 174 lb (78.9 kg)  ?03/16/21 171 lb 1.9 oz (77.6 kg)  ? ? ?Lab Results  ?Component Value Date  ? TSH 3.800 09/20/2021  ? ?Lab Results  ?Component Value Date  ? WBC 4.8 10

## 2021-09-20 NOTE — Patient Instructions (Signed)
Please continue taking medications as prescribed.  Please continue to follow low salt diet and perform moderate exercise/walking at least 150 mins/week. 

## 2021-09-21 LAB — TSH+FREE T4
Free T4: 2.1 ng/dL — ABNORMAL HIGH (ref 0.82–1.77)
TSH: 3.8 u[IU]/mL (ref 0.450–4.500)

## 2021-09-24 NOTE — Assessment & Plan Note (Signed)
Well controlled ?On Protonix as needed ?

## 2021-09-24 NOTE — Assessment & Plan Note (Signed)
Lab Results  Component Value Date   TSH 3.800 09/20/2021   On Levothyroxine 100 mcg QD now - tolerating it well, was on NP thyroid in the past 

## 2021-09-24 NOTE — Assessment & Plan Note (Addendum)
Well controlled with Ativan as needed, takes it infrequently ?

## 2021-09-24 NOTE — Assessment & Plan Note (Signed)
BP Readings from Last 1 Encounters:  ?09/20/21 122/88  ? ?Well-controlled with Losartan-HCTZ ?Counseled for compliance with the medications ?Advised DASH diet and moderate exercise/walking, at least 150 mins/week ?

## 2021-09-24 NOTE — Assessment & Plan Note (Signed)
Well-controlled with Wellbutrin ?Ativan as needed for anxiety ?

## 2021-10-02 ENCOUNTER — Other Ambulatory Visit: Payer: Self-pay | Admitting: Internal Medicine

## 2021-10-02 DIAGNOSIS — F3341 Major depressive disorder, recurrent, in partial remission: Secondary | ICD-10-CM

## 2021-10-03 ENCOUNTER — Encounter: Payer: Self-pay | Admitting: Dermatology

## 2021-10-03 NOTE — Progress Notes (Signed)
? ?  Follow-Up Visit ?  ?Subjective  ?Crystal Mccarthy is a 66 y.o. female who presents for the following: Follow-up (LN 2 on face- healing well). ? ?New lesions on arm and cheek ?Location:  ?Duration:  ?Quality:  ?Associated Signs/Symptoms: ?Modifying Factors:  ?Severity:  ?Timing: ?Context:  ? ?Objective  ?Well appearing patient in no apparent distress; mood and affect are within normal limits. ?Right Forearm - Anterior ?5 mm pink gritty crust ? ?left  jawline ?Pink-tan inflamed flattopped 6 mm keratosis ? ? ? ?A focused examination was performed including arms, face, neck. Relevant physical exam findings are noted in the Assessment and Plan. ? ? ?Assessment & Plan  ? ? ?AK (actinic keratosis) ?Right Forearm - Anterior ? ?Destruction of lesion - Right Forearm - Anterior ?Complexity: simple   ?Destruction method: cryotherapy   ?Informed consent: discussed and consent obtained   ?Timeout:  patient name, date of birth, surgical site, and procedure verified ?Lesion destroyed using liquid nitrogen: Yes   ?Cryotherapy cycles:  3 ?Outcome: patient tolerated procedure well with no complications   ? ?Seborrheic keratosis, inflamed ?left  jawline ? ?Destruction of lesion - left  jawline ?Complexity: simple   ?Destruction method: cryotherapy   ?Informed consent: discussed and consent obtained   ?Timeout:  patient name, date of birth, surgical site, and procedure verified ?Lesion destroyed using liquid nitrogen: Yes   ?Cryotherapy cycles:  3 ?Outcome: patient tolerated procedure well with no complications   ? ? ? ? ? ?I, [name], MD, have reviewed all documentation for this visit.  The documentation on 10/03/21 for the exam, diagnosis, procedures, and orders are all accurate and complete. ?

## 2021-10-04 ENCOUNTER — Encounter: Payer: Self-pay | Admitting: Internal Medicine

## 2021-10-30 ENCOUNTER — Other Ambulatory Visit: Payer: Self-pay | Admitting: Internal Medicine

## 2021-12-02 ENCOUNTER — Other Ambulatory Visit: Payer: Self-pay | Admitting: Internal Medicine

## 2021-12-02 DIAGNOSIS — E039 Hypothyroidism, unspecified: Secondary | ICD-10-CM

## 2021-12-07 ENCOUNTER — Encounter: Payer: Self-pay | Admitting: Internal Medicine

## 2021-12-07 ENCOUNTER — Other Ambulatory Visit: Payer: Self-pay

## 2021-12-07 DIAGNOSIS — E039 Hypothyroidism, unspecified: Secondary | ICD-10-CM

## 2021-12-07 MED ORDER — LEVOTHYROXINE SODIUM 125 MCG PO TABS: 125.0000 ug | ORAL_TABLET | Freq: Every day | ORAL | 1 refills | Status: DC

## 2021-12-08 DIAGNOSIS — N959 Unspecified menopausal and perimenopausal disorder: Secondary | ICD-10-CM | POA: Diagnosis not present

## 2021-12-17 ENCOUNTER — Encounter: Payer: Self-pay | Admitting: Internal Medicine

## 2021-12-20 ENCOUNTER — Other Ambulatory Visit: Payer: Self-pay | Admitting: Internal Medicine

## 2021-12-20 DIAGNOSIS — Z87898 Personal history of other specified conditions: Secondary | ICD-10-CM

## 2021-12-20 MED ORDER — SCOPOLAMINE 1 MG/3DAYS TD PT72
1.0000 | MEDICATED_PATCH | TRANSDERMAL | 1 refills | Status: AC
Start: 2021-12-20 — End: ?

## 2021-12-22 ENCOUNTER — Other Ambulatory Visit: Payer: Self-pay | Admitting: Internal Medicine

## 2021-12-22 DIAGNOSIS — G43709 Chronic migraine without aura, not intractable, without status migrainosus: Secondary | ICD-10-CM

## 2021-12-27 ENCOUNTER — Other Ambulatory Visit: Payer: Self-pay | Admitting: Internal Medicine

## 2021-12-27 ENCOUNTER — Encounter: Payer: Self-pay | Admitting: Internal Medicine

## 2021-12-27 DIAGNOSIS — F3341 Major depressive disorder, recurrent, in partial remission: Secondary | ICD-10-CM

## 2021-12-27 MED ORDER — BUPROPION HCL ER (XL) 300 MG PO TB24: 300.0000 mg | ORAL_TABLET | Freq: Every day | ORAL | 3 refills | Status: DC

## 2022-01-23 ENCOUNTER — Other Ambulatory Visit: Payer: Self-pay | Admitting: Internal Medicine

## 2022-01-25 ENCOUNTER — Ambulatory Visit (INDEPENDENT_AMBULATORY_CARE_PROVIDER_SITE_OTHER): Payer: Medicare HMO | Admitting: Nurse Practitioner

## 2022-01-25 ENCOUNTER — Encounter: Payer: Self-pay | Admitting: Nurse Practitioner

## 2022-01-25 VITALS — BP 132/90 | HR 65 | Temp 98.9°F | Ht 63.0 in | Wt 156.0 lb

## 2022-01-25 DIAGNOSIS — K219 Gastro-esophageal reflux disease without esophagitis: Secondary | ICD-10-CM

## 2022-01-25 DIAGNOSIS — M79621 Pain in right upper arm: Secondary | ICD-10-CM | POA: Insufficient documentation

## 2022-01-25 DIAGNOSIS — R0789 Other chest pain: Secondary | ICD-10-CM | POA: Insufficient documentation

## 2022-01-25 DIAGNOSIS — R42 Dizziness and giddiness: Secondary | ICD-10-CM

## 2022-01-25 MED ORDER — MECLIZINE HCL 12.5 MG PO TABS: 12.5000 mg | ORAL_TABLET | Freq: Three times a day (TID) | ORAL | 0 refills | Status: AC | PRN

## 2022-01-25 MED ORDER — PANTOPRAZOLE SODIUM 40 MG PO TBEC: 40.0000 mg | DELAYED_RELEASE_TABLET | Freq: Every day | ORAL | 3 refills | Status: AC | PRN

## 2022-01-25 NOTE — Assessment & Plan Note (Signed)
Currently denies pain  Take tylenol as needed.

## 2022-01-25 NOTE — Assessment & Plan Note (Signed)
Start taking Protonix 40 mg daily Avoid fried fatty foods spicy foods, chocolates avoid eating late in the night Patient told to take Protonix 40 mg daily as needed once her condition is well controlled

## 2022-01-25 NOTE — Assessment & Plan Note (Addendum)
EKG showed NSR rate of 61 No concern for ischemia Her chest tightness might be due to GERD Take protonix '40mg'$  daily , avoid eating late in the night , avoid spicy, fried foods, chocolates .

## 2022-01-25 NOTE — Patient Instructions (Addendum)
Please take Antivert 12.'5mg'$  three times daily as needed for your vertigo.   Please take tylenol '650mg'$  every 6 hours as needed for your shoulder pain   Please take  protonix '40mg'$  daily   It is important that you exercise regularly at least 30 minutes 5 times a week.  Think about what you will eat, plan ahead. Choose " clean, green, fresh or frozen" over canned, processed or packaged foods which are more sugary, salty and fatty. 70 to 75% of food eaten should be vegetables and fruit. Three meals at set times with snacks allowed between meals, but they must be fruit or vegetables. Aim to eat over a 12 hour period , example 7 am to 7 pm, and STOP after  your last meal of the day. Drink water,generally about 64 ounces per day, no other drink is as healthy. Fruit juice is best enjoyed in a healthy way, by EATING the fruit.  Thanks for choosing [facility], we consider it a privelige to serve you.

## 2022-01-25 NOTE — Assessment & Plan Note (Signed)
Start  Antivert 12.'5mg'$  TID PRN  Get up slowly when getting up from a sitting position.

## 2022-01-25 NOTE — Progress Notes (Signed)
   Crystal Mccarthy     MRN: [id number]      DOB: 02-25-1956   HPI Crystal Mccarthy with past medical history of depression, motion sickness, GERD, vitamin D deficiency, is here for complaints of dizziness.  Patient stated that 3 days ago she had  dizziness after standing up from a siting position,  her dizziness was shortly followed by nausea.  Patient stated that she woke up this morning with chest tightness she ate some soup last night, had constant burping, her chest tightness is rated 4/10 she currently denies dizziness, she has been careful not to bend over.  She denies headache, seizure, weakness, changes in her vision, abdominal pain, burning sensation in her chest , cough, tinnitus, . She has been taking pantoprazole '40mg'$  daily as needed.   Has intermittent right upper arm pain, plays golf, currently denies pain , does not take any medication for her pain .    ROS Denies recent fever or chills. Denies sinus pressure, nasal congestion, ear pain or sore throat. Denies chest congestion, productive cough or wheezing. Denies palpitations and leg swelling Denies dysuria, frequency, hesitancy or incontinence. Denies joint pain, swelling and limitation in mobility. Denies headaches, seizures, numbness, or tingling. Denies depression, anxiety or insomnia.   PE  BP (!) 132/90 (BP Location: Right Arm, Cuff Size: Normal)   Pulse 65   Temp 98.9 F (37.2 C)   Ht '5\' 2"'$  (1.575 m)   Wt 156 lb (70.8 kg)   SpO2 97%   BMI 28.53 kg/m   Patient alert and oriented and in no cardiopulmonary distress.  HEENT: No facial asymmetry, EOMI,     Neck supple .  Chest: Clear to auscultation bilaterally.  CVS: S1, S2 no murmurs, no S3.Regular rate.  ABD: Soft non tender.   Ext: No edema  MS: Adequate ROM spine, shoulders, hips and knees.  Psych: Good eye contact, normal affect. Memory intact not anxious or depressed appearing.  CNS: CN 2-12 intact, power,  normal throughout.no focal deficits  noted.   Assessment & Plan  Vertigo Start  Antivert 12.'5mg'$  TID PRN  Get up slowly when getting up from a sitting position.   Pain in right upper arm Currently denies pain  Take tylenol as needed.   Chest tightness EKG showed NSR rate of 61 No concern for ischemia Her chest tightness might be due to GERD Take protonix '40mg'$  daily , avoid eating late in the night , avoid spicy, fried foods, chocolates .   GERD (gastroesophageal reflux disease) Start taking Protonix 40 mg daily Avoid fried fatty foods spicy foods, chocolates avoid eating late in the night Patient told to take Protonix 40 mg daily as needed once her condition is well controlled

## 2022-01-31 ENCOUNTER — Telehealth: Payer: Self-pay | Admitting: Internal Medicine

## 2022-01-31 NOTE — Telephone Encounter (Signed)
Patient called in with FYI's/ updates   Patient needs height updated patient is 5'3  Patient is taking 2000ut of vit D3  Patient is no longer using scopolamine (TRANSDERM-SCOP) 1 MG/3DAYS     And patient has had NO dizzy spells

## 2022-02-01 ENCOUNTER — Encounter: Payer: Self-pay | Admitting: Internal Medicine

## 2022-02-20 ENCOUNTER — Other Ambulatory Visit: Payer: Self-pay | Admitting: Internal Medicine

## 2022-02-26 ENCOUNTER — Encounter: Payer: Self-pay | Admitting: Internal Medicine

## 2022-02-28 ENCOUNTER — Other Ambulatory Visit: Payer: Self-pay | Admitting: *Deleted

## 2022-02-28 DIAGNOSIS — G43709 Chronic migraine without aura, not intractable, without status migrainosus: Secondary | ICD-10-CM

## 2022-02-28 MED ORDER — ELETRIPTAN HYDROBROMIDE 40 MG PO TABS: ORAL_TABLET | ORAL | 0 refills | Status: DC

## 2022-03-14 ENCOUNTER — Encounter: Payer: Self-pay | Admitting: Internal Medicine

## 2022-03-15 ENCOUNTER — Other Ambulatory Visit: Payer: Self-pay | Admitting: Internal Medicine

## 2022-03-15 ENCOUNTER — Ambulatory Visit (INDEPENDENT_AMBULATORY_CARE_PROVIDER_SITE_OTHER): Payer: Medicare HMO | Admitting: Internal Medicine

## 2022-03-15 ENCOUNTER — Encounter: Payer: Self-pay | Admitting: Internal Medicine

## 2022-03-15 ENCOUNTER — Telehealth: Payer: Self-pay | Admitting: *Deleted

## 2022-03-15 VITALS — BP 132/84 | HR 62 | Resp 18 | Ht 63.0 in | Wt 155.6 lb

## 2022-03-15 DIAGNOSIS — L02212 Cutaneous abscess of back [any part, except buttock]: Secondary | ICD-10-CM | POA: Diagnosis not present

## 2022-03-15 DIAGNOSIS — F3341 Major depressive disorder, recurrent, in partial remission: Secondary | ICD-10-CM

## 2022-03-15 DIAGNOSIS — E039 Hypothyroidism, unspecified: Secondary | ICD-10-CM

## 2022-03-15 DIAGNOSIS — Z23 Encounter for immunization: Secondary | ICD-10-CM

## 2022-03-15 DIAGNOSIS — E559 Vitamin D deficiency, unspecified: Secondary | ICD-10-CM

## 2022-03-15 DIAGNOSIS — E782 Mixed hyperlipidemia: Secondary | ICD-10-CM

## 2022-03-15 DIAGNOSIS — I10 Essential (primary) hypertension: Secondary | ICD-10-CM

## 2022-03-15 MED ORDER — CEPHALEXIN 500 MG PO CAPS: 500.0000 mg | ORAL_CAPSULE | Freq: Three times a day (TID) | ORAL | 0 refills | Status: DC

## 2022-03-15 NOTE — Telephone Encounter (Signed)
scheduled

## 2022-03-15 NOTE — Telephone Encounter (Signed)
error 

## 2022-03-15 NOTE — Progress Notes (Signed)
Acute Office Visit  Subjective:    Patient ID: Crystal Mccarthy, female    DOB: 02-15-1956, 66 y.o.   MRN: [id number]  Chief Complaint  Patient presents with   Acute Visit    Patient has sore on back noticed 03-09-22 getting larger it hurts all the time     HPI Patient is in today for complaint of a sore on her back that she noticed about  a week ago.  She states that it has been getting larger and has been painful upon sitting due to pressure on that area.  Denies any recent injury or insect bite.  She has a history of infected cyst in the past.  The sore has opened head now and has drained clear liquid.  She denies any fever, chills, nausea, vomiting or local bleeding.  Past Medical History:  Diagnosis Date   Atypical mole 06/25/2020   Right Buttock (moderate)   Basal cell carcinoma 1995   nose ( [place])   Depression 05/01/2019   Essential hypertension, benign Yes   Hypothyroidism, adult 05/01/2019   Primary ovarian failure 05/01/2019   Vitamin D deficiency disease 05/01/2019    Past Surgical History:  Procedure Laterality Date   BREAST BIOPSY Right 06/2019   negative   cataract surgery     COLONOSCOPY  2016   [place]; per patient, 1 polyp with recommendations to repeat in 5 years   COLONOSCOPY  2010   [place], per patient, normal exam   COLONOSCOPY WITH PROPOFOL N/A 02/10/2020   Procedure: COLONOSCOPY WITH PROPOFOL;  Surgeon: Daneil Dolin, MD;  [facility];  Service: Endoscopy;  Laterality: N/A;  1:30pm   POLYPECTOMY  02/10/2020   Procedure: POLYPECTOMY;  Surgeon: Daneil Dolin, MD;  [facility];  Service: Endoscopy;;    Family History  Problem Relation Age of Onset   Dementia Mother    Heart disease Father    Parkinson's disease Father    Obesity Sister    Colon cancer Neg Hx     Social History   Socioeconomic History   Marital status: Married    Spouse name: Not on file   Number of children: 3   Years of  education: 12   Highest education level: Bachelor's degree (e.g., BA, AB, BS)  Occupational History   Not on file  Tobacco Use   Smoking status: Never   Smokeless tobacco: Never  Vaping Use   Vaping Use: Never used  Substance and Sexual Activity   Alcohol use: Yes    Comment: occ- special occasions   Drug use: Never   Sexual activity: Not Currently  Other Topics Concern   Not on file  Social History Narrative   Married for 70 years,lives with husband.Retired.   Social Determinants of Health   Financial Resource Strain: Low Risk  (07/15/2021)   Overall Financial Resource Strain (CARDIA)    Difficulty of Paying Living Expenses: Not hard at all  Food Insecurity: No Food Insecurity (07/15/2021)   Hunger Vital Sign    Worried About Running Out of Food in the Last Year: Never true    Ran Out of Food in the Last Year: Never true  Transportation Needs: No Transportation Needs (07/15/2021)   PRAPARE - Hydrologist (Medical): No    Lack of Transportation (Non-Medical): No  Physical Activity: Insufficiently Active (07/15/2021)   Exercise Vital Sign    Days of Exercise per Week: 4 days  Minutes of Exercise per Session: 30 min  Stress: No Stress Concern Present (07/15/2021)   [place]    Feeling of Stress : Only a little  Social Connections: Socially Integrated (07/15/2021)   Social Connection and Isolation Panel [NHANES]    Frequency of Communication with Friends and Family: More than three times a week    Frequency of Social Gatherings with Friends and Family: Three times a week    Attends Religious Services: More than 4 times per year    Active Member of Clubs or Organizations: Yes    Attends Archivist Meetings: More than 4 times per year    Marital Status: Married  Human resources officer Violence: Not At Risk (07/15/2021)   Humiliation, Afraid, Rape, and Kick questionnaire    Fear of  Current or Ex-Partner: No    Emotionally Abused: No    Physically Abused: No    Sexually Abused: No    Outpatient Medications Prior to Visit  Medication Sig Dispense Refill   Ascorbic Acid (VITAMIN C) 1000 MG tablet Take 1,000 mg by mouth daily.     aspirin EC 81 MG tablet Take 81 mg by mouth at bedtime.      buPROPion (WELLBUTRIN XL) 300 MG 24 hr tablet Take 1 tablet (300 mg total) by mouth daily. 90 tablet 3   Cholecalciferol (VITAMIN D-3) 125 MCG (5000 UT) TABS Take 5,000 Units by mouth daily.      Coenzyme Q10 300 MG CAPS Take 300 mg by mouth at bedtime.     COLLAGEN PO Take by mouth. Twice daily     eletriptan (RELPAX) 40 MG tablet take 1 tablet by mouth every 2 hours as needed for migraine or headache 18 tablet 0   estradiol (ESTRACE) 1 MG tablet Take 1.5 mg by mouth daily.     Krill Oil 500 MG CAPS Take 500 mg by mouth daily.     levothyroxine (SYNTHROID) 125 MCG tablet Take 1 tablet (125 mcg total) by mouth daily. 90 tablet 1   LORazepam (ATIVAN) 0.5 MG tablet TAKE 1 TABLET BY MOUTH ONCE DAILY AS NEEDED FOR ANXIETY 30 tablet 3   losartan-hydrochlorothiazide (HYZAAR) 100-25 MG tablet Take 1 tablet by mouth once daily 90 tablet 0   Magnesium 500 MG TABS Take 500 mg by mouth daily.      meclizine (ANTIVERT) 12.5 MG tablet Take 1 tablet (12.5 mg total) by mouth 3 (three) times daily as needed for dizziness. 30 tablet 0   Melatonin 3 MG CAPS Take 3 mg by mouth every evening.     pantoprazole (PROTONIX) 40 MG tablet Take 1 tablet (40 mg total) by mouth daily as needed (Heartburn). 30 tablet 3   Probiotic Product (ALIGN) 4 MG CAPS Take 4 mg by mouth daily.     progesterone (PROMETRIUM) 200 MG capsule Take 2 capsules (400 mg total) by mouth daily. 180 capsule 1   scopolamine (TRANSDERM-SCOP) 1 MG/3DAYS Place 1 patch (1.5 mg total) onto the skin every 3 (three) days. 10 patch 1   Testosterone 1.62 % GEL Place 2.5 mg onto the skin daily. Testosterone 1% cream, apply 1 click (2.5 mg) daily to  the labia. 30 g 1   valACYclovir (VALTREX) 1000 MG tablet Take 1 tablet (1,000 mg total) by mouth daily as needed (Fever Blisters). 30 tablet 0   No facility-administered medications prior to visit.    Allergies  Allergen Reactions   Montelukast Sodium Hives  Review of Systems  Constitutional:  Negative for chills and fever.  HENT:  Negative for congestion, sinus pressure, sinus pain and sore throat.   Eyes:  Negative for pain and discharge.  Respiratory:  Negative for cough and shortness of breath.   Cardiovascular:  Negative for chest pain and palpitations.  Gastrointestinal:  Negative for abdominal pain, diarrhea, nausea and vomiting.  Endocrine: Negative for polydipsia and polyuria.  Genitourinary:  Negative for dysuria and hematuria.  Musculoskeletal:  Negative for neck pain and neck stiffness.  Skin:        Skin sore on back  Neurological:  Negative for dizziness and weakness.  Psychiatric/Behavioral:  Negative for agitation and behavioral problems.        Objective:    Physical Exam Vitals reviewed.  Constitutional:      General: She is not in acute distress.    Appearance: She is not diaphoretic.  HENT:     Head: Normocephalic and atraumatic.  Eyes:     General: No scleral icterus.    Extraocular Movements: Extraocular movements intact.  Cardiovascular:     Rate and Rhythm: Normal rate and regular rhythm.     Heart sounds: Normal heart sounds. No murmur heard. Pulmonary:     Breath sounds: Normal breath sounds. No wheezing or rales.  Musculoskeletal:     Cervical back: Neck supple. No tenderness.     Right lower leg: No edema.     Left lower leg: No edema.  Skin:    General: Skin is warm.     Findings: Abscess (About 2 cm in diameter over upper back, with central opening, dried crusting noted) present.  Neurological:     General: No focal deficit present.     Mental Status: She is alert and oriented to person, place, and time.  Psychiatric:         Mood and Affect: Mood normal.        Behavior: Behavior normal.     BP 132/84 (BP Location: Right Arm, Patient Position: Sitting, Cuff Size: Normal)   Pulse 62   Resp 18   Ht '5\' 3"'$  (1.6 m)   Wt 155 lb 9.6 oz (70.6 kg)   SpO2 95%   BMI 27.56 kg/m  Wt Readings from Last 3 Encounters:  03/15/22 155 lb 9.6 oz (70.6 kg)  01/25/22 156 lb (70.8 kg)  09/20/21 170 lb 9.6 oz (77.4 kg)        Assessment & Plan:   Problem List Items Addressed This Visit    Visit Diagnoses     Abscess of back    -  Primary Could be due to infected subcutaneous cyst and/or folliculitis Started Keflex Has started draining, would avoid I&D for now Keep area clean and dry   Relevant Medications   cephALEXin (KEFLEX) 500 MG capsule        Meds ordered this encounter  Medications   cephALEXin (KEFLEX) 500 MG capsule    Sig: Take 1 capsule (500 mg total) by mouth 3 (three) times daily.    Dispense:  15 capsule    Refill:  0     Myishia Kasik Keith Rake, MD

## 2022-03-23 ENCOUNTER — Encounter: Payer: Medicare HMO | Admitting: Internal Medicine

## 2022-03-24 ENCOUNTER — Ambulatory Visit (INDEPENDENT_AMBULATORY_CARE_PROVIDER_SITE_OTHER): Payer: Medicare HMO | Admitting: Internal Medicine

## 2022-03-24 ENCOUNTER — Encounter: Payer: Self-pay | Admitting: Internal Medicine

## 2022-03-24 VITALS — BP 136/84 | HR 70 | Resp 18 | Ht 63.0 in | Wt 158.0 lb

## 2022-03-24 DIAGNOSIS — I10 Essential (primary) hypertension: Secondary | ICD-10-CM

## 2022-03-24 DIAGNOSIS — E782 Mixed hyperlipidemia: Secondary | ICD-10-CM

## 2022-03-24 DIAGNOSIS — E039 Hypothyroidism, unspecified: Secondary | ICD-10-CM

## 2022-03-24 DIAGNOSIS — E559 Vitamin D deficiency, unspecified: Secondary | ICD-10-CM

## 2022-03-24 DIAGNOSIS — L02212 Cutaneous abscess of back [any part, except buttock]: Secondary | ICD-10-CM | POA: Diagnosis not present

## 2022-03-24 DIAGNOSIS — Z0001 Encounter for general adult medical examination with abnormal findings: Secondary | ICD-10-CM

## 2022-03-24 MED ORDER — DOXYCYCLINE HYCLATE 100 MG PO TABS: 100.0000 mg | ORAL_TABLET | Freq: Two times a day (BID) | ORAL | 0 refills | Status: DC

## 2022-03-24 NOTE — Assessment & Plan Note (Signed)
Last lipid profile reviewed (2021) Check repeat lipid profile

## 2022-03-24 NOTE — Assessment & Plan Note (Signed)
BP Readings from Last 1 Encounters:  03/24/22 136/84   Well-controlled with Losartan-HCTZ Counseled for compliance with the medications Advised DASH diet and moderate exercise/walking, at least 150 mins/week

## 2022-03-24 NOTE — Assessment & Plan Note (Signed)
Physical exam as documented. Fasting blood tests today. Up to date with flu, pneumococcal and Shingrix vaccines.

## 2022-03-24 NOTE — Patient Instructions (Addendum)
Please continue taking medications as prescribed.  Please continue to follow low salt diet and perform moderate exercise/walking at least 150 mins/week.  You are being referred to: [facility] [address] [place], [place] [address] [phone]   Thank you for choosing  Primary Care! We consider it our privilege to take care of you.  Good luck on the moving process! Please contact us if you need any help till you find a new provider.

## 2022-03-24 NOTE — Progress Notes (Signed)
Established Patient Office Visit  Subjective:  Patient ID: Crystal Mccarthy, female    DOB: April 18, 1956  Age: 66 y.o. MRN: [id number]  CC:  Chief Complaint  Patient presents with   Annual Exam    Annual exam pt still has sore on back no better     HPI Crystal Mccarthy is a 66 y.o. female with past medical history of HTN, migraine, GERD, hypothyroidism, depression with anxiety who presents for annual physical.  She still has a sore on her back, which has been about the same size despite taking Keflex.  She has severe tenderness in the area.  She has redness and swelling around the sore.  She has had purulent discharge from the sore.  Denies any fever or chills.  BP is well-controlled. Takes medications regularly. Patient denies headache, dizziness, chest pain, dyspnea or palpitations.   She has been taking Levothyroxine instead of NP thyroid now and has been tolerating it well. She denies any recent change in appetite. Denies any fatigue.  She takes Ativan as needed for spells of anxiety.  Denies any SI or HI.    Past Medical History:  Diagnosis Date   Atypical mole 06/25/2020   Right Buttock (moderate)   Basal cell carcinoma 1995   nose ( [place])   Depression 05/01/2019   Essential hypertension, benign Yes   Hypothyroidism, adult 05/01/2019   Primary ovarian failure 05/01/2019   Vitamin D deficiency disease 05/01/2019    Past Surgical History:  Procedure Laterality Date   BREAST BIOPSY Right 06/2019   negative   cataract surgery     COLONOSCOPY  2016   [place]; per patient, 1 polyp with recommendations to repeat in 5 years   COLONOSCOPY  2010   [place], per patient, normal exam   COLONOSCOPY WITH PROPOFOL N/A 02/10/2020   Procedure: COLONOSCOPY WITH PROPOFOL;  Surgeon: Daneil Dolin, MD;  [facility];  Service: Endoscopy;  Laterality: N/A;  1:30pm   POLYPECTOMY  02/10/2020   Procedure: POLYPECTOMY;  Surgeon: Daneil Dolin, MD;   [facility];  Service: Endoscopy;;    Family History  Problem Relation Age of Onset   Dementia Mother    Heart disease Father    Parkinson's disease Father    Obesity Sister    Colon cancer Neg Hx     Social History   Socioeconomic History   Marital status: Married    Spouse name: Not on file   Number of children: 3   Years of education: 12   Highest education level: Bachelor's degree (e.g., BA, AB, BS)  Occupational History   Not on file  Tobacco Use   Smoking status: Never   Smokeless tobacco: Never  Vaping Use   Vaping Use: Never used  Substance and Sexual Activity   Alcohol use: Yes    Comment: occ- special occasions   Drug use: Never   Sexual activity: Not Currently  Other Topics Concern   Not on file  Social History Narrative   Married for 67 years,lives with husband.Retired.   Social Determinants of Health   Financial Resource Strain: Low Risk  (07/15/2021)   Overall Financial Resource Strain (CARDIA)    Difficulty of Paying Living Expenses: Not hard at all  Food Insecurity: No Food Insecurity (07/15/2021)   Hunger Vital Sign    Worried About Running Out of Food in the Last Year: Never true    Ran Out of Food in the Last Year: Never true  Transportation Needs: No Transportation Needs (07/15/2021)   PRAPARE - Transportation    Lack of Transportation (Medical): No    Lack of Transportation (Non-Medical): No  Physical Activity: Insufficiently Active (07/15/2021)   Exercise Vital Sign    Days of Exercise per Week: 4 days    Minutes of Exercise per Session: 30 min  Stress: No Stress Concern Present (07/15/2021)   [employer] - Occupational Stress Questionnaire    Feeling of Stress : Only a little  Social Connections: Socially Integrated (07/15/2021)   Social Connection and Isolation Panel [NHANES]    Frequency of Communication with Friends and Family: More than three times a week    Frequency of Social Gatherings with Friends  and Family: Three times a week    Attends Religious Services: More than 4 times per year    Active Member of Clubs or Organizations: Yes    Attends Club or Organization Meetings: More than 4 times per year    Marital Status: Married  Intimate Partner Violence: Not At Risk (07/15/2021)   Humiliation, Afraid, Rape, and Kick questionnaire    Fear of Current or Ex-Partner: No    Emotionally Abused: No    Physically Abused: No    Sexually Abused: No    Outpatient Medications Prior to Visit  Medication Sig Dispense Refill   Ascorbic Acid (VITAMIN C) 1000 MG tablet Take 1,000 mg by mouth daily.     aspirin EC 81 MG tablet Take 81 mg by mouth at bedtime.      buPROPion (WELLBUTRIN XL) 300 MG 24 hr tablet Take 1 tablet (300 mg total) by mouth daily. 90 tablet 3   Cholecalciferol (VITAMIN D-3) 125 MCG (5000 UT) TABS Take 5,000 Units by mouth daily.      Coenzyme Q10 300 MG CAPS Take 300 mg by mouth at bedtime.     COLLAGEN PO Take by mouth. Twice daily     eletriptan (RELPAX) 40 MG tablet take 1 tablet by mouth every 2 hours as needed for migraine or headache 18 tablet 0   estradiol (ESTRACE) 1 MG tablet Take 1.5 mg by mouth daily.     Krill Oil 500 MG CAPS Take 500 mg by mouth daily.     levothyroxine (SYNTHROID) 125 MCG tablet Take 1 tablet (125 mcg total) by mouth daily. 90 tablet 1   LORazepam (ATIVAN) 0.5 MG tablet TAKE 1 TABLET BY MOUTH ONCE DAILY AS NEEDED FOR ANXIETY 30 tablet 3   losartan-hydrochlorothiazide (HYZAAR) 100-25 MG tablet Take 1 tablet by mouth once daily 90 tablet 0   Magnesium 500 MG TABS Take 500 mg by mouth daily.      meclizine (ANTIVERT) 12.5 MG tablet Take 1 tablet (12.5 mg total) by mouth 3 (three) times daily as needed for dizziness. 30 tablet 0   Melatonin 3 MG CAPS Take 3 mg by mouth every evening.     pantoprazole (PROTONIX) 40 MG tablet Take 1 tablet (40 mg total) by mouth daily as needed (Heartburn). 30 tablet 3   Probiotic Product (ALIGN) 4 MG CAPS Take 4 mg by  mouth daily.     progesterone (PROMETRIUM) 200 MG capsule Take 2 capsules (400 mg total) by mouth daily. 180 capsule 1   scopolamine (TRANSDERM-SCOP) 1 MG/3DAYS Place 1 patch (1.5 mg total) onto the skin every 3 (three) days. 10 patch 1   Testosterone 1.62 % GEL Place 2.5 mg onto the skin daily. Testosterone 1% cream, apply 1 click (2.5 mg)   daily to the labia. 30 g 1   valACYclovir (VALTREX) 1000 MG tablet Take 1 tablet (1,000 mg total) by mouth daily as needed (Fever Blisters). 30 tablet 0   cephALEXin (KEFLEX) 500 MG capsule Take 1 capsule (500 mg total) by mouth 3 (three) times daily. 15 capsule 0   No facility-administered medications prior to visit.    Allergies  Allergen Reactions   Montelukast Sodium Hives    ROS Review of Systems  Constitutional:  Negative for chills and fever.  HENT:  Negative for congestion, sinus pressure, sinus pain and sore throat.   Eyes:  Negative for pain and discharge.  Respiratory:  Negative for cough and shortness of breath.   Cardiovascular:  Negative for chest pain and palpitations.  Gastrointestinal:  Negative for abdominal pain, diarrhea, nausea and vomiting.  Endocrine: Negative for polydipsia and polyuria.  Genitourinary:  Negative for dysuria and hematuria.  Musculoskeletal:  Negative for neck pain and neck stiffness.  Skin:        Skin sore on back  Neurological:  Negative for dizziness and weakness.  Psychiatric/Behavioral:  Negative for agitation and behavioral problems.       Objective:    Physical Exam Vitals reviewed.  Constitutional:      General: She is not in acute distress.    Appearance: She is not diaphoretic.  HENT:     Head: Normocephalic and atraumatic.  Eyes:     General: No scleral icterus.    Extraocular Movements: Extraocular movements intact.  Cardiovascular:     Rate and Rhythm: Normal rate and regular rhythm.     Heart sounds: Normal heart sounds. No murmur heard. Pulmonary:     Breath sounds: Normal  breath sounds. No wheezing or rales.  Abdominal:     Palpations: Abdomen is soft.     Tenderness: There is no abdominal tenderness.  Musculoskeletal:     Cervical back: Neck supple. No tenderness.     Right lower leg: No edema.     Left lower leg: No edema.  Skin:    General: Skin is warm.     Findings: Abscess (About 3 cm in diameter over upper back, with central opening, dried crusting noted) present.  Neurological:     General: No focal deficit present.     Mental Status: She is alert and oriented to person, place, and time.     Cranial Nerves: No cranial nerve deficit.     Sensory: No sensory deficit.     Motor: No weakness.  Psychiatric:        Mood and Affect: Mood normal.        Behavior: Behavior normal.     BP 136/84 (BP Location: Right Arm, Patient Position: Sitting, Cuff Size: Normal)   Pulse 70   Resp 18   Ht 5' 3" (1.6 m)   Wt 158 lb (71.7 kg)   SpO2 95%   BMI 27.99 kg/m  Wt Readings from Last 3 Encounters:  03/24/22 158 lb (71.7 kg)  03/15/22 155 lb 9.6 oz (70.6 kg)  01/25/22 156 lb (70.8 kg)    Lab Results  Component Value Date   TSH 3.800 09/20/2021   Lab Results  Component Value Date   WBC 4.8 03/24/2021   HGB 14.4 03/24/2021   HCT 42.6 03/24/2021   MCV 90 03/24/2021   PLT 390 03/24/2021   Lab Results  Component Value Date   NA 139 03/24/2021   K 4.0 03/24/2021   CO2 24 03/24/2021     GLUCOSE 90 03/24/2021   BUN 19 03/24/2021   CREATININE 0.94 03/24/2021   BILITOT 0.3 03/24/2021   ALKPHOS 83 03/24/2021   AST 16 03/24/2021   ALT 11 03/24/2021   PROT 6.6 03/24/2021   ALBUMIN 3.9 03/24/2021   CALCIUM 9.8 03/24/2021   ANIONGAP 7 02/07/2020   EGFR 67 03/24/2021   Lab Results  Component Value Date   CHOL 243 (H) 03/24/2021   Lab Results  Component Value Date   HDL 56 03/24/2021   Lab Results  Component Value Date   LDLCALC 161 (H) 03/24/2021   Lab Results  Component Value Date   TRIG 144 03/24/2021   Lab Results  Component  Value Date   CHOLHDL 4.3 03/24/2021   No results found for: "HGBA1C"    Assessment & Plan:   Problem List Items Addressed This Visit       Cardiovascular and Mediastinum   Essential hypertension, benign    BP Readings from Last 1 Encounters:  03/24/22 136/84  Well-controlled with Losartan-HCTZ Counseled for compliance with the medications Advised DASH diet and moderate exercise/walking, at least 150 mins/week      Relevant Orders   CMP14+EGFR     Endocrine   Hypothyroidism    Lab Results  Component Value Date   TSH 3.800 09/20/2021  On Levothyroxine 100 mcg QD now - tolerating it well, was on NP thyroid in the past      Relevant Orders   TSH + free T4     Other   Vitamin D deficiency disease   Relevant Orders   VITAMIN D 25 Hydroxy (Vit-D Deficiency, Fractures)   Mixed hyperlipidemia    Last lipid profile reviewed (2021) Check repeat lipid profile      Relevant Orders   Lipid panel   Encounter for general adult medical examination with abnormal findings - Primary    Physical exam as documented. Fasting blood tests today. Up to date with flu, pneumococcal and Shingrix vaccines.      Relevant Orders   CMP14+EGFR   CBC with Differential/Platelet   Abscess of back    Likely due to carbuncle or infected subcutaneous cyst Did not resolve with oral antibiotic - Keflex Switched to Bactrim for MRSA coverage Keep area clean and dry Referred to general surgery in [place] as she is moving there      Relevant Medications   doxycycline (VIBRA-TABS) 100 MG tablet   Other Relevant Orders   Ambulatory referral to General Surgery    Meds ordered this encounter  Medications   doxycycline (VIBRA-TABS) 100 MG tablet    Sig: Take 1 tablet (100 mg total) by mouth 2 (two) times daily.    Dispense:  14 tablet    Refill:  0    Follow-up: Return if symptoms worsen or fail to improve.    Lindell Spar, MD

## 2022-03-24 NOTE — Assessment & Plan Note (Signed)
Lab Results  Component Value Date   TSH 3.800 09/20/2021   On Levothyroxine 100 mcg QD now - tolerating it well, was on NP thyroid in the past

## 2022-03-24 NOTE — Assessment & Plan Note (Signed)
Likely due to carbuncle or infected subcutaneous cyst Did not resolve with oral antibiotic - Keflex Switched to Bactrim for MRSA coverage Keep area clean and dry Referred to general surgery in [place] as she is moving there

## 2022-03-25 LAB — CBC WITH DIFFERENTIAL/PLATELET
Basophils Absolute: 0 10*3/uL (ref 0.0–0.2)
Basos: 1 %
EOS (ABSOLUTE): 0.1 10*3/uL (ref 0.0–0.4)
Eos: 2 %
Hematocrit: 37.1 % (ref 34.0–46.6)
Hemoglobin: 12.6 g/dL (ref 11.1–15.9)
Immature Grans (Abs): 0 10*3/uL (ref 0.0–0.1)
Immature Granulocytes: 0 %
Lymphocytes Absolute: 1.2 10*3/uL (ref 0.7–3.1)
Lymphs: 21 %
MCH: 31.2 pg (ref 26.6–33.0)
MCHC: 34 g/dL (ref 31.5–35.7)
MCV: 92 fL (ref 79–97)
Monocytes Absolute: 0.4 10*3/uL (ref 0.1–0.9)
Monocytes: 7 %
Neutrophils Absolute: 4.1 10*3/uL (ref 1.4–7.0)
Neutrophils: 69 %
Platelets: 383 10*3/uL (ref 150–450)
RBC: 4.04 x10E6/uL (ref 3.77–5.28)
RDW: 11.4 % — ABNORMAL LOW (ref 11.7–15.4)
WBC: 5.9 10*3/uL (ref 3.4–10.8)

## 2022-03-25 LAB — LIPID PANEL
Chol/HDL Ratio: 3.4 ratio (ref 0.0–4.4)
Cholesterol, Total: 222 mg/dL — ABNORMAL HIGH (ref 100–199)
HDL: 65 mg/dL (ref >39)
LDL Chol Calc (NIH): 150 mg/dL — ABNORMAL HIGH (ref 0–99)
Triglycerides: 44 mg/dL (ref 0–149)
VLDL Cholesterol Cal: 7 mg/dL (ref 5–40)

## 2022-03-25 LAB — CMP14+EGFR
ALT: 15 IU/L (ref 0–32)
AST: 20 IU/L (ref 0–40)
Albumin/Globulin Ratio: 1.8 (ref 1.2–2.2)
Albumin: 4.2 g/dL (ref 3.9–4.9)
Alkaline Phosphatase: 92 IU/L (ref 44–121)
BUN/Creatinine Ratio: 16 (ref 12–28)
BUN: 15 mg/dL (ref 8–27)
Bilirubin Total: 0.5 mg/dL (ref 0.0–1.2)
CO2: 22 mmol/L (ref 20–29)
Calcium: 9.5 mg/dL (ref 8.7–10.3)
Chloride: 102 mmol/L (ref 96–106)
Creatinine, Ser: 0.94 mg/dL (ref 0.57–1.00)
Globulin, Total: 2.3 g/dL (ref 1.5–4.5)
Glucose: 74 mg/dL (ref 70–99)
Potassium: 3.8 mmol/L (ref 3.5–5.2)
Sodium: 140 mmol/L (ref 134–144)
Total Protein: 6.5 g/dL (ref 6.0–8.5)
eGFR: 66 mL/min/{1.73_m2} (ref >59)

## 2022-03-25 LAB — TSH+FREE T4
Free T4: 1.83 ng/dL — ABNORMAL HIGH (ref 0.82–1.77)
TSH: 7.46 u[IU]/mL — ABNORMAL HIGH (ref 0.450–4.500)

## 2022-03-25 LAB — VITAMIN D 25 HYDROXY (VIT D DEFICIENCY, FRACTURES): Vit D, 25-Hydroxy: 49.1 ng/mL (ref 30.0–100.0)

## 2022-03-28 ENCOUNTER — Encounter: Payer: Self-pay | Admitting: Internal Medicine

## 2022-04-01 ENCOUNTER — Encounter: Payer: Self-pay | Admitting: Internal Medicine

## 2022-04-01 NOTE — Addendum Note (Signed)
Addended byIhor Dow on: 04/01/2022 01:10 PM   Modules accepted: Orders

## 2022-04-04 ENCOUNTER — Other Ambulatory Visit: Payer: Self-pay | Admitting: *Deleted

## 2022-04-04 ENCOUNTER — Encounter: Payer: Self-pay | Admitting: Internal Medicine

## 2022-04-04 DIAGNOSIS — F3341 Major depressive disorder, recurrent, in partial remission: Secondary | ICD-10-CM

## 2022-04-04 MED ORDER — BUPROPION HCL ER (XL) 300 MG PO TB24: 300.0000 mg | ORAL_TABLET | Freq: Every day | ORAL | 3 refills | Status: AC

## 2022-04-18 ENCOUNTER — Encounter: Payer: Medicare HMO | Admitting: Internal Medicine

## 2022-04-27 ENCOUNTER — Encounter: Payer: Self-pay | Admitting: Internal Medicine

## 2022-04-27 DIAGNOSIS — E039 Hypothyroidism, unspecified: Secondary | ICD-10-CM

## 2022-04-29 NOTE — Telephone Encounter (Signed)
Patient called left a voice mail 05/29/2022 at 12:28 pm asked if nurse will please return her call. She has sent several mychart messages and the questions are not getting all answers. Call back # [phone].

## 2022-05-06 ENCOUNTER — Encounter: Payer: Self-pay | Admitting: Internal Medicine

## 2022-05-08 ENCOUNTER — Other Ambulatory Visit: Payer: Self-pay | Admitting: Internal Medicine

## 2022-05-08 DIAGNOSIS — G43709 Chronic migraine without aura, not intractable, without status migrainosus: Secondary | ICD-10-CM

## 2022-05-08 DIAGNOSIS — I10 Essential (primary) hypertension: Secondary | ICD-10-CM

## 2022-05-08 DIAGNOSIS — F411 Generalized anxiety disorder: Secondary | ICD-10-CM

## 2022-05-08 DIAGNOSIS — E039 Hypothyroidism, unspecified: Secondary | ICD-10-CM

## 2022-05-08 MED ORDER — LORAZEPAM 0.5 MG PO TABS: ORAL_TABLET | ORAL | 1 refills | Status: AC

## 2022-05-08 MED ORDER — LOSARTAN POTASSIUM-HCTZ 100-25 MG PO TABS: 1.0000 | ORAL_TABLET | Freq: Every day | ORAL | 0 refills | Status: DC

## 2022-05-08 MED ORDER — LEVOTHYROXINE SODIUM 125 MCG PO TABS: 125.0000 ug | ORAL_TABLET | Freq: Every day | ORAL | 0 refills | Status: AC

## 2022-05-19 ENCOUNTER — Encounter: Payer: Self-pay | Admitting: Internal Medicine

## 2022-05-19 ENCOUNTER — Other Ambulatory Visit: Payer: Self-pay | Admitting: Internal Medicine

## 2022-05-19 DIAGNOSIS — G43709 Chronic migraine without aura, not intractable, without status migrainosus: Secondary | ICD-10-CM

## 2022-05-19 MED ORDER — ELETRIPTAN HYDROBROMIDE 40 MG PO TABS: ORAL_TABLET | ORAL | 3 refills | Status: AC

## 2022-05-23 DIAGNOSIS — M25511 Pain in right shoulder: Secondary | ICD-10-CM | POA: Diagnosis not present

## 2022-05-23 DIAGNOSIS — M779 Enthesopathy, unspecified: Secondary | ICD-10-CM | POA: Diagnosis not present

## 2022-05-23 DIAGNOSIS — K219 Gastro-esophageal reflux disease without esophagitis: Secondary | ICD-10-CM | POA: Diagnosis not present

## 2022-05-23 DIAGNOSIS — M542 Cervicalgia: Secondary | ICD-10-CM | POA: Diagnosis not present

## 2022-05-23 DIAGNOSIS — F419 Anxiety disorder, unspecified: Secondary | ICD-10-CM | POA: Diagnosis not present

## 2022-05-23 DIAGNOSIS — K036 Deposits [accretions] on teeth: Secondary | ICD-10-CM | POA: Diagnosis not present

## 2022-05-23 DIAGNOSIS — H539 Unspecified visual disturbance: Secondary | ICD-10-CM | POA: Diagnosis not present

## 2022-05-25 DIAGNOSIS — M19011 Primary osteoarthritis, right shoulder: Secondary | ICD-10-CM | POA: Diagnosis not present

## 2022-05-25 DIAGNOSIS — M779 Enthesopathy, unspecified: Secondary | ICD-10-CM | POA: Diagnosis not present

## 2022-05-27 DIAGNOSIS — H5213 Myopia, bilateral: Secondary | ICD-10-CM | POA: Diagnosis not present

## 2022-05-27 DIAGNOSIS — Z135 Encounter for screening for eye and ear disorders: Secondary | ICD-10-CM | POA: Diagnosis not present

## 2022-05-27 DIAGNOSIS — H524 Presbyopia: Secondary | ICD-10-CM | POA: Diagnosis not present

## 2022-06-20 ENCOUNTER — Ambulatory Visit: Payer: Medicare HMO | Admitting: Dermatology

## 2022-06-22 DIAGNOSIS — M19011 Primary osteoarthritis, right shoulder: Secondary | ICD-10-CM | POA: Diagnosis not present

## 2022-07-04 DIAGNOSIS — M542 Cervicalgia: Secondary | ICD-10-CM | POA: Diagnosis not present

## 2022-07-14 DIAGNOSIS — H5211 Myopia, right eye: Secondary | ICD-10-CM | POA: Diagnosis not present

## 2022-07-21 DIAGNOSIS — L821 Other seborrheic keratosis: Secondary | ICD-10-CM | POA: Diagnosis not present

## 2022-07-21 DIAGNOSIS — L815 Leukoderma, not elsewhere classified: Secondary | ICD-10-CM | POA: Diagnosis not present

## 2022-07-21 DIAGNOSIS — Z08 Encounter for follow-up examination after completed treatment for malignant neoplasm: Secondary | ICD-10-CM | POA: Diagnosis not present

## 2022-07-21 DIAGNOSIS — Z1283 Encounter for screening for malignant neoplasm of skin: Secondary | ICD-10-CM | POA: Diagnosis not present

## 2022-07-21 DIAGNOSIS — Z7189 Other specified counseling: Secondary | ICD-10-CM | POA: Diagnosis not present

## 2022-07-21 DIAGNOSIS — L814 Other melanin hyperpigmentation: Secondary | ICD-10-CM | POA: Diagnosis not present

## 2022-07-21 DIAGNOSIS — Z85828 Personal history of other malignant neoplasm of skin: Secondary | ICD-10-CM | POA: Diagnosis not present

## 2022-08-06 ENCOUNTER — Other Ambulatory Visit: Payer: Self-pay | Admitting: Internal Medicine

## 2022-08-06 DIAGNOSIS — I10 Essential (primary) hypertension: Secondary | ICD-10-CM

## 2022-08-08 DIAGNOSIS — M542 Cervicalgia: Secondary | ICD-10-CM | POA: Diagnosis not present

## 2022-08-11 ENCOUNTER — Encounter: Payer: Self-pay | Admitting: Radiology

## 2022-08-22 DIAGNOSIS — D519 Vitamin B12 deficiency anemia, unspecified: Secondary | ICD-10-CM | POA: Diagnosis not present

## 2022-08-22 DIAGNOSIS — D559 Anemia due to enzyme disorder, unspecified: Secondary | ICD-10-CM | POA: Diagnosis not present

## 2022-08-22 DIAGNOSIS — R739 Hyperglycemia, unspecified: Secondary | ICD-10-CM | POA: Diagnosis not present

## 2022-08-22 DIAGNOSIS — E782 Mixed hyperlipidemia: Secondary | ICD-10-CM | POA: Diagnosis not present

## 2022-08-22 DIAGNOSIS — F419 Anxiety disorder, unspecified: Secondary | ICD-10-CM | POA: Diagnosis not present

## 2022-08-22 DIAGNOSIS — E039 Hypothyroidism, unspecified: Secondary | ICD-10-CM | POA: Diagnosis not present

## 2022-08-22 DIAGNOSIS — R7989 Other specified abnormal findings of blood chemistry: Secondary | ICD-10-CM | POA: Diagnosis not present

## 2022-08-22 DIAGNOSIS — I10 Essential (primary) hypertension: Secondary | ICD-10-CM | POA: Diagnosis not present

## 2022-08-22 DIAGNOSIS — K219 Gastro-esophageal reflux disease without esophagitis: Secondary | ICD-10-CM | POA: Diagnosis not present

## 2022-08-25 DIAGNOSIS — M19011 Primary osteoarthritis, right shoulder: Secondary | ICD-10-CM | POA: Diagnosis not present

## 2022-09-08 DIAGNOSIS — Z1231 Encounter for screening mammogram for malignant neoplasm of breast: Secondary | ICD-10-CM | POA: Diagnosis not present

## 2022-10-07 DIAGNOSIS — F3289 Other specified depressive episodes: Secondary | ICD-10-CM | POA: Diagnosis not present

## 2022-10-07 DIAGNOSIS — H00014 Hordeolum externum left upper eyelid: Secondary | ICD-10-CM | POA: Diagnosis not present

## 2022-10-07 DIAGNOSIS — M25511 Pain in right shoulder: Secondary | ICD-10-CM | POA: Diagnosis not present

## 2022-10-07 DIAGNOSIS — G8929 Other chronic pain: Secondary | ICD-10-CM | POA: Diagnosis not present

## 2022-10-10 DIAGNOSIS — F32A Depression, unspecified: Secondary | ICD-10-CM | POA: Diagnosis not present

## 2022-10-10 DIAGNOSIS — Z982 Presence of cerebrospinal fluid drainage device: Secondary | ICD-10-CM | POA: Diagnosis not present

## 2022-10-10 DIAGNOSIS — R011 Cardiac murmur, unspecified: Secondary | ICD-10-CM | POA: Diagnosis not present

## 2022-10-10 DIAGNOSIS — E039 Hypothyroidism, unspecified: Secondary | ICD-10-CM | POA: Diagnosis not present

## 2022-10-10 DIAGNOSIS — B009 Herpesviral infection, unspecified: Secondary | ICD-10-CM | POA: Diagnosis not present

## 2022-10-10 DIAGNOSIS — M19011 Primary osteoarthritis, right shoulder: Secondary | ICD-10-CM | POA: Diagnosis not present

## 2022-10-10 DIAGNOSIS — Z1331 Encounter for screening for depression: Secondary | ICD-10-CM | POA: Diagnosis not present

## 2022-10-10 DIAGNOSIS — R891 Abnormal level of hormones in specimens from other organs, systems and tissues: Secondary | ICD-10-CM | POA: Diagnosis not present

## 2022-10-10 DIAGNOSIS — K219 Gastro-esophageal reflux disease without esophagitis: Secondary | ICD-10-CM | POA: Diagnosis not present

## 2022-10-10 DIAGNOSIS — G43001 Migraine without aura, not intractable, with status migrainosus: Secondary | ICD-10-CM | POA: Diagnosis not present

## 2022-10-10 DIAGNOSIS — G4700 Insomnia, unspecified: Secondary | ICD-10-CM | POA: Diagnosis not present

## 2022-10-10 DIAGNOSIS — E785 Hyperlipidemia, unspecified: Secondary | ICD-10-CM | POA: Diagnosis not present

## 2022-10-20 DIAGNOSIS — B009 Herpesviral infection, unspecified: Secondary | ICD-10-CM | POA: Diagnosis not present

## 2022-10-20 DIAGNOSIS — W19XXXA Unspecified fall, initial encounter: Secondary | ICD-10-CM | POA: Diagnosis not present

## 2022-10-20 DIAGNOSIS — M4316 Spondylolisthesis, lumbar region: Secondary | ICD-10-CM | POA: Diagnosis not present

## 2022-10-20 DIAGNOSIS — R011 Cardiac murmur, unspecified: Secondary | ICD-10-CM | POA: Diagnosis not present

## 2022-10-20 DIAGNOSIS — G4700 Insomnia, unspecified: Secondary | ICD-10-CM | POA: Diagnosis not present

## 2022-10-20 DIAGNOSIS — M4317 Spondylolisthesis, lumbosacral region: Secondary | ICD-10-CM | POA: Diagnosis not present

## 2022-10-20 DIAGNOSIS — M545 Low back pain, unspecified: Secondary | ICD-10-CM | POA: Diagnosis not present

## 2022-10-20 DIAGNOSIS — M47815 Spondylosis without myelopathy or radiculopathy, thoracolumbar region: Secondary | ICD-10-CM | POA: Diagnosis not present

## 2022-10-20 DIAGNOSIS — E785 Hyperlipidemia, unspecified: Secondary | ICD-10-CM | POA: Diagnosis not present

## 2022-10-20 DIAGNOSIS — G43001 Migraine without aura, not intractable, with status migrainosus: Secondary | ICD-10-CM | POA: Diagnosis not present

## 2022-10-20 DIAGNOSIS — Z9849 Cataract extraction status, unspecified eye: Secondary | ICD-10-CM | POA: Diagnosis not present

## 2022-10-20 DIAGNOSIS — K219 Gastro-esophageal reflux disease without esophagitis: Secondary | ICD-10-CM | POA: Diagnosis not present

## 2022-10-20 DIAGNOSIS — M19011 Primary osteoarthritis, right shoulder: Secondary | ICD-10-CM | POA: Diagnosis not present

## 2022-11-02 DIAGNOSIS — M4854XA Collapsed vertebra, not elsewhere classified, thoracic region, initial encounter for fracture: Secondary | ICD-10-CM | POA: Diagnosis not present

## 2022-11-03 DIAGNOSIS — M19011 Primary osteoarthritis, right shoulder: Secondary | ICD-10-CM | POA: Diagnosis not present

## 2022-11-03 DIAGNOSIS — M25511 Pain in right shoulder: Secondary | ICD-10-CM | POA: Diagnosis not present

## 2022-11-08 DIAGNOSIS — Z01818 Encounter for other preprocedural examination: Secondary | ICD-10-CM | POA: Diagnosis not present

## 2022-11-10 ENCOUNTER — Other Ambulatory Visit: Payer: Self-pay | Admitting: Internal Medicine

## 2022-11-10 DIAGNOSIS — I10 Essential (primary) hypertension: Secondary | ICD-10-CM

## 2022-11-18 ENCOUNTER — Other Ambulatory Visit: Payer: Self-pay | Admitting: Internal Medicine

## 2022-11-18 DIAGNOSIS — I10 Essential (primary) hypertension: Secondary | ICD-10-CM

## 2022-11-30 DIAGNOSIS — F32A Depression, unspecified: Secondary | ICD-10-CM | POA: Diagnosis not present

## 2022-11-30 DIAGNOSIS — K219 Gastro-esophageal reflux disease without esophagitis: Secondary | ICD-10-CM | POA: Diagnosis not present

## 2022-11-30 DIAGNOSIS — E039 Hypothyroidism, unspecified: Secondary | ICD-10-CM | POA: Diagnosis not present

## 2022-11-30 DIAGNOSIS — G8918 Other acute postprocedural pain: Secondary | ICD-10-CM | POA: Diagnosis not present

## 2022-11-30 DIAGNOSIS — M7521 Bicipital tendinitis, right shoulder: Secondary | ICD-10-CM | POA: Diagnosis not present

## 2022-11-30 DIAGNOSIS — M19011 Primary osteoarthritis, right shoulder: Secondary | ICD-10-CM | POA: Diagnosis not present

## 2022-11-30 DIAGNOSIS — I10 Essential (primary) hypertension: Secondary | ICD-10-CM | POA: Diagnosis not present

## 2022-11-30 DIAGNOSIS — Z6831 Body mass index (BMI) 31.0-31.9, adult: Secondary | ICD-10-CM | POA: Diagnosis not present

## 2022-11-30 DIAGNOSIS — F419 Anxiety disorder, unspecified: Secondary | ICD-10-CM | POA: Diagnosis not present

## 2022-11-30 DIAGNOSIS — E669 Obesity, unspecified: Secondary | ICD-10-CM | POA: Diagnosis not present

## 2022-11-30 DIAGNOSIS — Z79899 Other long term (current) drug therapy: Secondary | ICD-10-CM | POA: Diagnosis not present

## 2022-11-30 DIAGNOSIS — Z471 Aftercare following joint replacement surgery: Secondary | ICD-10-CM | POA: Diagnosis not present

## 2022-11-30 DIAGNOSIS — Z85828 Personal history of other malignant neoplasm of skin: Secondary | ICD-10-CM | POA: Diagnosis not present

## 2022-11-30 DIAGNOSIS — Z79890 Hormone replacement therapy (postmenopausal): Secondary | ICD-10-CM | POA: Diagnosis not present

## 2022-11-30 DIAGNOSIS — Z96611 Presence of right artificial shoulder joint: Secondary | ICD-10-CM | POA: Diagnosis not present

## 2022-12-06 DIAGNOSIS — M19011 Primary osteoarthritis, right shoulder: Secondary | ICD-10-CM | POA: Diagnosis not present

## 2022-12-06 DIAGNOSIS — Z96611 Presence of right artificial shoulder joint: Secondary | ICD-10-CM | POA: Diagnosis not present

## 2022-12-06 DIAGNOSIS — M25411 Effusion, right shoulder: Secondary | ICD-10-CM | POA: Diagnosis not present

## 2022-12-06 DIAGNOSIS — M6281 Muscle weakness (generalized): Secondary | ICD-10-CM | POA: Diagnosis not present

## 2022-12-06 DIAGNOSIS — G5601 Carpal tunnel syndrome, right upper limb: Secondary | ICD-10-CM | POA: Diagnosis not present

## 2022-12-06 DIAGNOSIS — M25511 Pain in right shoulder: Secondary | ICD-10-CM | POA: Diagnosis not present

## 2022-12-08 DIAGNOSIS — M25511 Pain in right shoulder: Secondary | ICD-10-CM | POA: Diagnosis not present

## 2022-12-08 DIAGNOSIS — M25411 Effusion, right shoulder: Secondary | ICD-10-CM | POA: Diagnosis not present

## 2022-12-08 DIAGNOSIS — M6281 Muscle weakness (generalized): Secondary | ICD-10-CM | POA: Diagnosis not present

## 2022-12-12 DIAGNOSIS — M25411 Effusion, right shoulder: Secondary | ICD-10-CM | POA: Diagnosis not present

## 2022-12-12 DIAGNOSIS — M6281 Muscle weakness (generalized): Secondary | ICD-10-CM | POA: Diagnosis not present

## 2022-12-12 DIAGNOSIS — M25511 Pain in right shoulder: Secondary | ICD-10-CM | POA: Diagnosis not present

## 2022-12-14 DIAGNOSIS — M6281 Muscle weakness (generalized): Secondary | ICD-10-CM | POA: Diagnosis not present

## 2022-12-14 DIAGNOSIS — M25511 Pain in right shoulder: Secondary | ICD-10-CM | POA: Diagnosis not present

## 2022-12-14 DIAGNOSIS — M25411 Effusion, right shoulder: Secondary | ICD-10-CM | POA: Diagnosis not present

## 2022-12-19 DIAGNOSIS — M25411 Effusion, right shoulder: Secondary | ICD-10-CM | POA: Diagnosis not present

## 2022-12-19 DIAGNOSIS — M6281 Muscle weakness (generalized): Secondary | ICD-10-CM | POA: Diagnosis not present

## 2022-12-19 DIAGNOSIS — M25511 Pain in right shoulder: Secondary | ICD-10-CM | POA: Diagnosis not present

## 2022-12-21 DIAGNOSIS — M25511 Pain in right shoulder: Secondary | ICD-10-CM | POA: Diagnosis not present

## 2022-12-21 DIAGNOSIS — M6281 Muscle weakness (generalized): Secondary | ICD-10-CM | POA: Diagnosis not present

## 2022-12-21 DIAGNOSIS — M4854XA Collapsed vertebra, not elsewhere classified, thoracic region, initial encounter for fracture: Secondary | ICD-10-CM | POA: Diagnosis not present

## 2022-12-21 DIAGNOSIS — M25411 Effusion, right shoulder: Secondary | ICD-10-CM | POA: Diagnosis not present

## 2022-12-27 DIAGNOSIS — M25511 Pain in right shoulder: Secondary | ICD-10-CM | POA: Diagnosis not present

## 2022-12-27 DIAGNOSIS — M6281 Muscle weakness (generalized): Secondary | ICD-10-CM | POA: Diagnosis not present

## 2022-12-27 DIAGNOSIS — M25411 Effusion, right shoulder: Secondary | ICD-10-CM | POA: Diagnosis not present

## 2022-12-29 DIAGNOSIS — M25511 Pain in right shoulder: Secondary | ICD-10-CM | POA: Diagnosis not present

## 2022-12-29 DIAGNOSIS — M6281 Muscle weakness (generalized): Secondary | ICD-10-CM | POA: Diagnosis not present

## 2022-12-29 DIAGNOSIS — M25411 Effusion, right shoulder: Secondary | ICD-10-CM | POA: Diagnosis not present

## 2023-01-03 DIAGNOSIS — M6281 Muscle weakness (generalized): Secondary | ICD-10-CM | POA: Diagnosis not present

## 2023-01-03 DIAGNOSIS — M25411 Effusion, right shoulder: Secondary | ICD-10-CM | POA: Diagnosis not present

## 2023-01-03 DIAGNOSIS — M25511 Pain in right shoulder: Secondary | ICD-10-CM | POA: Diagnosis not present

## 2023-01-09 DIAGNOSIS — W19XXXA Unspecified fall, initial encounter: Secondary | ICD-10-CM | POA: Diagnosis not present

## 2023-01-09 DIAGNOSIS — G43001 Migraine without aura, not intractable, with status migrainosus: Secondary | ICD-10-CM | POA: Diagnosis not present

## 2023-01-09 DIAGNOSIS — B009 Herpesviral infection, unspecified: Secondary | ICD-10-CM | POA: Diagnosis not present

## 2023-01-09 DIAGNOSIS — M4316 Spondylolisthesis, lumbar region: Secondary | ICD-10-CM | POA: Diagnosis not present

## 2023-01-09 DIAGNOSIS — K219 Gastro-esophageal reflux disease without esophagitis: Secondary | ICD-10-CM | POA: Diagnosis not present

## 2023-01-09 DIAGNOSIS — R011 Cardiac murmur, unspecified: Secondary | ICD-10-CM | POA: Diagnosis not present

## 2023-01-09 DIAGNOSIS — M545 Low back pain, unspecified: Secondary | ICD-10-CM | POA: Diagnosis not present

## 2023-01-09 DIAGNOSIS — Z9849 Cataract extraction status, unspecified eye: Secondary | ICD-10-CM | POA: Diagnosis not present

## 2023-01-09 DIAGNOSIS — G4700 Insomnia, unspecified: Secondary | ICD-10-CM | POA: Diagnosis not present

## 2023-01-09 DIAGNOSIS — M19011 Primary osteoarthritis, right shoulder: Secondary | ICD-10-CM | POA: Diagnosis not present

## 2023-01-09 DIAGNOSIS — E785 Hyperlipidemia, unspecified: Secondary | ICD-10-CM | POA: Diagnosis not present

## 2023-01-10 DIAGNOSIS — M6281 Muscle weakness (generalized): Secondary | ICD-10-CM | POA: Diagnosis not present

## 2023-01-10 DIAGNOSIS — M25511 Pain in right shoulder: Secondary | ICD-10-CM | POA: Diagnosis not present

## 2023-01-10 DIAGNOSIS — M25411 Effusion, right shoulder: Secondary | ICD-10-CM | POA: Diagnosis not present

## 2023-01-12 DIAGNOSIS — M25411 Effusion, right shoulder: Secondary | ICD-10-CM | POA: Diagnosis not present

## 2023-01-12 DIAGNOSIS — M6281 Muscle weakness (generalized): Secondary | ICD-10-CM | POA: Diagnosis not present

## 2023-01-12 DIAGNOSIS — M25511 Pain in right shoulder: Secondary | ICD-10-CM | POA: Diagnosis not present

## 2023-01-16 DIAGNOSIS — R09A2 Foreign body sensation, throat: Secondary | ICD-10-CM | POA: Diagnosis not present

## 2023-01-16 DIAGNOSIS — J387 Other diseases of larynx: Secondary | ICD-10-CM | POA: Diagnosis not present

## 2023-01-16 DIAGNOSIS — K219 Gastro-esophageal reflux disease without esophagitis: Secondary | ICD-10-CM | POA: Diagnosis not present

## 2023-01-19 DIAGNOSIS — M25511 Pain in right shoulder: Secondary | ICD-10-CM | POA: Diagnosis not present

## 2023-01-19 DIAGNOSIS — M6281 Muscle weakness (generalized): Secondary | ICD-10-CM | POA: Diagnosis not present

## 2023-01-19 DIAGNOSIS — M25411 Effusion, right shoulder: Secondary | ICD-10-CM | POA: Diagnosis not present

## 2023-01-23 DIAGNOSIS — M5136 Other intervertebral disc degeneration, lumbar region: Secondary | ICD-10-CM | POA: Diagnosis not present

## 2023-01-23 DIAGNOSIS — Z981 Arthrodesis status: Secondary | ICD-10-CM | POA: Diagnosis not present

## 2023-01-23 DIAGNOSIS — M438X4 Other specified deforming dorsopathies, thoracic region: Secondary | ICD-10-CM | POA: Diagnosis not present

## 2023-01-23 DIAGNOSIS — M47814 Spondylosis without myelopathy or radiculopathy, thoracic region: Secondary | ICD-10-CM | POA: Diagnosis not present

## 2023-01-23 DIAGNOSIS — W19XXXA Unspecified fall, initial encounter: Secondary | ICD-10-CM | POA: Diagnosis not present

## 2023-01-23 DIAGNOSIS — M4317 Spondylolisthesis, lumbosacral region: Secondary | ICD-10-CM | POA: Diagnosis not present

## 2023-01-23 DIAGNOSIS — M5134 Other intervertebral disc degeneration, thoracic region: Secondary | ICD-10-CM | POA: Diagnosis not present

## 2023-01-23 DIAGNOSIS — M4804 Spinal stenosis, thoracic region: Secondary | ICD-10-CM | POA: Diagnosis not present

## 2023-01-24 DIAGNOSIS — M25411 Effusion, right shoulder: Secondary | ICD-10-CM | POA: Diagnosis not present

## 2023-01-24 DIAGNOSIS — M6281 Muscle weakness (generalized): Secondary | ICD-10-CM | POA: Diagnosis not present

## 2023-01-24 DIAGNOSIS — M25511 Pain in right shoulder: Secondary | ICD-10-CM | POA: Diagnosis not present

## 2023-01-26 DIAGNOSIS — M25511 Pain in right shoulder: Secondary | ICD-10-CM | POA: Diagnosis not present

## 2023-01-26 DIAGNOSIS — M25411 Effusion, right shoulder: Secondary | ICD-10-CM | POA: Diagnosis not present

## 2023-01-26 DIAGNOSIS — M6281 Muscle weakness (generalized): Secondary | ICD-10-CM | POA: Diagnosis not present

## 2023-01-27 DIAGNOSIS — M545 Low back pain, unspecified: Secondary | ICD-10-CM | POA: Diagnosis not present

## 2023-01-27 DIAGNOSIS — M4316 Spondylolisthesis, lumbar region: Secondary | ICD-10-CM | POA: Diagnosis not present

## 2023-01-31 DIAGNOSIS — M6281 Muscle weakness (generalized): Secondary | ICD-10-CM | POA: Diagnosis not present

## 2023-01-31 DIAGNOSIS — M25511 Pain in right shoulder: Secondary | ICD-10-CM | POA: Diagnosis not present

## 2023-01-31 DIAGNOSIS — M25411 Effusion, right shoulder: Secondary | ICD-10-CM | POA: Diagnosis not present

## 2023-02-02 DIAGNOSIS — M25411 Effusion, right shoulder: Secondary | ICD-10-CM | POA: Diagnosis not present

## 2023-02-02 DIAGNOSIS — M25511 Pain in right shoulder: Secondary | ICD-10-CM | POA: Diagnosis not present

## 2023-02-02 DIAGNOSIS — M6281 Muscle weakness (generalized): Secondary | ICD-10-CM | POA: Diagnosis not present

## 2023-02-07 DIAGNOSIS — M25411 Effusion, right shoulder: Secondary | ICD-10-CM | POA: Diagnosis not present

## 2023-02-07 DIAGNOSIS — M25511 Pain in right shoulder: Secondary | ICD-10-CM | POA: Diagnosis not present

## 2023-02-07 DIAGNOSIS — M6281 Muscle weakness (generalized): Secondary | ICD-10-CM | POA: Diagnosis not present

## 2023-02-09 DIAGNOSIS — M6281 Muscle weakness (generalized): Secondary | ICD-10-CM | POA: Diagnosis not present

## 2023-02-09 DIAGNOSIS — M25511 Pain in right shoulder: Secondary | ICD-10-CM | POA: Diagnosis not present

## 2023-02-09 DIAGNOSIS — M25411 Effusion, right shoulder: Secondary | ICD-10-CM | POA: Diagnosis not present

## 2023-02-09 DIAGNOSIS — M19011 Primary osteoarthritis, right shoulder: Secondary | ICD-10-CM | POA: Diagnosis not present

## 2023-02-09 DIAGNOSIS — Z96611 Presence of right artificial shoulder joint: Secondary | ICD-10-CM | POA: Diagnosis not present

## 2023-02-15 DIAGNOSIS — M6281 Muscle weakness (generalized): Secondary | ICD-10-CM | POA: Diagnosis not present

## 2023-02-15 DIAGNOSIS — M25411 Effusion, right shoulder: Secondary | ICD-10-CM | POA: Diagnosis not present

## 2023-02-15 DIAGNOSIS — M25511 Pain in right shoulder: Secondary | ICD-10-CM | POA: Diagnosis not present

## 2023-02-16 DIAGNOSIS — F4329 Adjustment disorder with other symptoms: Secondary | ICD-10-CM | POA: Diagnosis not present

## 2023-02-17 DIAGNOSIS — M25511 Pain in right shoulder: Secondary | ICD-10-CM | POA: Diagnosis not present

## 2023-02-17 DIAGNOSIS — M25411 Effusion, right shoulder: Secondary | ICD-10-CM | POA: Diagnosis not present

## 2023-02-17 DIAGNOSIS — M6281 Muscle weakness (generalized): Secondary | ICD-10-CM | POA: Diagnosis not present

## 2023-02-21 DIAGNOSIS — M25411 Effusion, right shoulder: Secondary | ICD-10-CM | POA: Diagnosis not present

## 2023-02-21 DIAGNOSIS — M25511 Pain in right shoulder: Secondary | ICD-10-CM | POA: Diagnosis not present

## 2023-02-21 DIAGNOSIS — M6281 Muscle weakness (generalized): Secondary | ICD-10-CM | POA: Diagnosis not present

## 2023-02-23 DIAGNOSIS — M6281 Muscle weakness (generalized): Secondary | ICD-10-CM | POA: Diagnosis not present

## 2023-02-23 DIAGNOSIS — M25511 Pain in right shoulder: Secondary | ICD-10-CM | POA: Diagnosis not present

## 2023-02-23 DIAGNOSIS — M25411 Effusion, right shoulder: Secondary | ICD-10-CM | POA: Diagnosis not present

## 2023-03-13 DIAGNOSIS — M25511 Pain in right shoulder: Secondary | ICD-10-CM | POA: Diagnosis not present

## 2023-03-13 DIAGNOSIS — M6281 Muscle weakness (generalized): Secondary | ICD-10-CM | POA: Diagnosis not present

## 2023-03-13 DIAGNOSIS — M25411 Effusion, right shoulder: Secondary | ICD-10-CM | POA: Diagnosis not present

## 2023-03-15 DIAGNOSIS — M6281 Muscle weakness (generalized): Secondary | ICD-10-CM | POA: Diagnosis not present

## 2023-03-15 DIAGNOSIS — M25411 Effusion, right shoulder: Secondary | ICD-10-CM | POA: Diagnosis not present

## 2023-03-15 DIAGNOSIS — M25511 Pain in right shoulder: Secondary | ICD-10-CM | POA: Diagnosis not present

## 2023-03-16 DIAGNOSIS — F4329 Adjustment disorder with other symptoms: Secondary | ICD-10-CM | POA: Diagnosis not present

## 2023-03-21 DIAGNOSIS — M6281 Muscle weakness (generalized): Secondary | ICD-10-CM | POA: Diagnosis not present

## 2023-03-21 DIAGNOSIS — M25511 Pain in right shoulder: Secondary | ICD-10-CM | POA: Diagnosis not present

## 2023-03-21 DIAGNOSIS — M25411 Effusion, right shoulder: Secondary | ICD-10-CM | POA: Diagnosis not present

## 2023-03-23 DIAGNOSIS — M4316 Spondylolisthesis, lumbar region: Secondary | ICD-10-CM | POA: Diagnosis not present

## 2023-03-23 DIAGNOSIS — M79641 Pain in right hand: Secondary | ICD-10-CM | POA: Diagnosis not present

## 2023-03-23 DIAGNOSIS — R011 Cardiac murmur, unspecified: Secondary | ICD-10-CM | POA: Diagnosis not present

## 2023-03-23 DIAGNOSIS — S22080A Wedge compression fracture of T11-T12 vertebra, initial encounter for closed fracture: Secondary | ICD-10-CM | POA: Diagnosis not present

## 2023-03-23 DIAGNOSIS — Z9849 Cataract extraction status, unspecified eye: Secondary | ICD-10-CM | POA: Diagnosis not present

## 2023-03-23 DIAGNOSIS — R202 Paresthesia of skin: Secondary | ICD-10-CM | POA: Diagnosis not present

## 2023-03-23 DIAGNOSIS — M25411 Effusion, right shoulder: Secondary | ICD-10-CM | POA: Diagnosis not present

## 2023-03-23 DIAGNOSIS — Z9889 Other specified postprocedural states: Secondary | ICD-10-CM | POA: Diagnosis not present

## 2023-03-23 DIAGNOSIS — W19XXXA Unspecified fall, initial encounter: Secondary | ICD-10-CM | POA: Diagnosis not present

## 2023-03-23 DIAGNOSIS — E785 Hyperlipidemia, unspecified: Secondary | ICD-10-CM | POA: Diagnosis not present

## 2023-03-23 DIAGNOSIS — Z23 Encounter for immunization: Secondary | ICD-10-CM | POA: Diagnosis not present

## 2023-03-23 DIAGNOSIS — M6281 Muscle weakness (generalized): Secondary | ICD-10-CM | POA: Diagnosis not present

## 2023-03-23 DIAGNOSIS — G5693 Unspecified mononeuropathy of bilateral upper limbs: Secondary | ICD-10-CM | POA: Diagnosis not present

## 2023-03-23 DIAGNOSIS — M25511 Pain in right shoulder: Secondary | ICD-10-CM | POA: Diagnosis not present

## 2023-03-23 DIAGNOSIS — M545 Low back pain, unspecified: Secondary | ICD-10-CM | POA: Diagnosis not present

## 2023-03-29 DIAGNOSIS — M25411 Effusion, right shoulder: Secondary | ICD-10-CM | POA: Diagnosis not present

## 2023-03-29 DIAGNOSIS — M6281 Muscle weakness (generalized): Secondary | ICD-10-CM | POA: Diagnosis not present

## 2023-03-29 DIAGNOSIS — M25511 Pain in right shoulder: Secondary | ICD-10-CM | POA: Diagnosis not present

## 2023-03-30 DIAGNOSIS — F4329 Adjustment disorder with other symptoms: Secondary | ICD-10-CM | POA: Diagnosis not present

## 2023-04-03 DIAGNOSIS — M545 Low back pain, unspecified: Secondary | ICD-10-CM | POA: Diagnosis not present

## 2023-04-06 DIAGNOSIS — M6281 Muscle weakness (generalized): Secondary | ICD-10-CM | POA: Diagnosis not present

## 2023-04-06 DIAGNOSIS — M25511 Pain in right shoulder: Secondary | ICD-10-CM | POA: Diagnosis not present

## 2023-04-06 DIAGNOSIS — G5603 Carpal tunnel syndrome, bilateral upper limbs: Secondary | ICD-10-CM | POA: Diagnosis not present

## 2023-04-06 DIAGNOSIS — M25411 Effusion, right shoulder: Secondary | ICD-10-CM | POA: Diagnosis not present

## 2023-04-10 DIAGNOSIS — M545 Low back pain, unspecified: Secondary | ICD-10-CM | POA: Diagnosis not present

## 2023-04-11 DIAGNOSIS — M6281 Muscle weakness (generalized): Secondary | ICD-10-CM | POA: Diagnosis not present

## 2023-04-11 DIAGNOSIS — M25411 Effusion, right shoulder: Secondary | ICD-10-CM | POA: Diagnosis not present

## 2023-04-11 DIAGNOSIS — M8589 Other specified disorders of bone density and structure, multiple sites: Secondary | ICD-10-CM | POA: Diagnosis not present

## 2023-04-11 DIAGNOSIS — M25511 Pain in right shoulder: Secondary | ICD-10-CM | POA: Diagnosis not present

## 2023-04-11 DIAGNOSIS — M19041 Primary osteoarthritis, right hand: Secondary | ICD-10-CM | POA: Diagnosis not present

## 2023-04-13 DIAGNOSIS — F4329 Adjustment disorder with other symptoms: Secondary | ICD-10-CM | POA: Diagnosis not present

## 2023-04-14 DIAGNOSIS — M545 Low back pain, unspecified: Secondary | ICD-10-CM | POA: Diagnosis not present

## 2023-04-17 DIAGNOSIS — M6281 Muscle weakness (generalized): Secondary | ICD-10-CM | POA: Diagnosis not present

## 2023-04-17 DIAGNOSIS — M25411 Effusion, right shoulder: Secondary | ICD-10-CM | POA: Diagnosis not present

## 2023-04-17 DIAGNOSIS — M25511 Pain in right shoulder: Secondary | ICD-10-CM | POA: Diagnosis not present

## 2023-04-25 DIAGNOSIS — M545 Low back pain, unspecified: Secondary | ICD-10-CM | POA: Diagnosis not present

## 2023-04-26 DIAGNOSIS — M6281 Muscle weakness (generalized): Secondary | ICD-10-CM | POA: Diagnosis not present

## 2023-04-26 DIAGNOSIS — M25511 Pain in right shoulder: Secondary | ICD-10-CM | POA: Diagnosis not present

## 2023-04-26 DIAGNOSIS — M25411 Effusion, right shoulder: Secondary | ICD-10-CM | POA: Diagnosis not present

## 2023-04-28 DIAGNOSIS — M545 Low back pain, unspecified: Secondary | ICD-10-CM | POA: Diagnosis not present

## 2023-05-01 DIAGNOSIS — M545 Low back pain, unspecified: Secondary | ICD-10-CM | POA: Diagnosis not present

## 2023-05-04 DIAGNOSIS — M545 Low back pain, unspecified: Secondary | ICD-10-CM | POA: Diagnosis not present

## 2023-05-17 DIAGNOSIS — M545 Low back pain, unspecified: Secondary | ICD-10-CM | POA: Diagnosis not present

## 2023-05-23 DIAGNOSIS — F4323 Adjustment disorder with mixed anxiety and depressed mood: Secondary | ICD-10-CM | POA: Diagnosis not present

## 2023-05-24 DIAGNOSIS — M545 Low back pain, unspecified: Secondary | ICD-10-CM | POA: Diagnosis not present

## 2023-05-31 DIAGNOSIS — M545 Low back pain, unspecified: Secondary | ICD-10-CM | POA: Diagnosis not present

## 2023-06-05 DIAGNOSIS — F4323 Adjustment disorder with mixed anxiety and depressed mood: Secondary | ICD-10-CM | POA: Diagnosis not present

## 2023-06-12 DIAGNOSIS — Z96611 Presence of right artificial shoulder joint: Secondary | ICD-10-CM | POA: Diagnosis not present

## 2023-06-12 DIAGNOSIS — M545 Low back pain, unspecified: Secondary | ICD-10-CM | POA: Diagnosis not present

## 2023-06-22 DIAGNOSIS — F4323 Adjustment disorder with mixed anxiety and depressed mood: Secondary | ICD-10-CM | POA: Diagnosis not present

## 2023-06-27 DIAGNOSIS — M545 Low back pain, unspecified: Secondary | ICD-10-CM | POA: Diagnosis not present

## 2023-06-30 DIAGNOSIS — M79641 Pain in right hand: Secondary | ICD-10-CM | POA: Diagnosis not present

## 2023-06-30 DIAGNOSIS — Z9849 Cataract extraction status, unspecified eye: Secondary | ICD-10-CM | POA: Diagnosis not present

## 2023-06-30 DIAGNOSIS — R011 Cardiac murmur, unspecified: Secondary | ICD-10-CM | POA: Diagnosis not present

## 2023-06-30 DIAGNOSIS — E785 Hyperlipidemia, unspecified: Secondary | ICD-10-CM | POA: Diagnosis not present

## 2023-06-30 DIAGNOSIS — G5693 Unspecified mononeuropathy of bilateral upper limbs: Secondary | ICD-10-CM | POA: Diagnosis not present

## 2023-06-30 DIAGNOSIS — M4316 Spondylolisthesis, lumbar region: Secondary | ICD-10-CM | POA: Diagnosis not present

## 2023-06-30 DIAGNOSIS — M545 Low back pain, unspecified: Secondary | ICD-10-CM | POA: Diagnosis not present

## 2023-06-30 DIAGNOSIS — W19XXXA Unspecified fall, initial encounter: Secondary | ICD-10-CM | POA: Diagnosis not present

## 2023-06-30 DIAGNOSIS — R202 Paresthesia of skin: Secondary | ICD-10-CM | POA: Diagnosis not present

## 2023-06-30 DIAGNOSIS — S22080A Wedge compression fracture of T11-T12 vertebra, initial encounter for closed fracture: Secondary | ICD-10-CM | POA: Diagnosis not present

## 2023-06-30 DIAGNOSIS — R051 Acute cough: Secondary | ICD-10-CM | POA: Diagnosis not present

## 2023-06-30 DIAGNOSIS — Z9889 Other specified postprocedural states: Secondary | ICD-10-CM | POA: Diagnosis not present

## 2023-07-18 DIAGNOSIS — D510 Vitamin B12 deficiency anemia due to intrinsic factor deficiency: Secondary | ICD-10-CM | POA: Diagnosis not present

## 2023-07-18 DIAGNOSIS — D519 Vitamin B12 deficiency anemia, unspecified: Secondary | ICD-10-CM | POA: Diagnosis not present

## 2023-07-18 DIAGNOSIS — E569 Vitamin deficiency, unspecified: Secondary | ICD-10-CM | POA: Diagnosis not present

## 2023-07-18 DIAGNOSIS — E559 Vitamin D deficiency, unspecified: Secondary | ICD-10-CM | POA: Diagnosis not present

## 2023-07-18 DIAGNOSIS — A6920 Lyme disease, unspecified: Secondary | ICD-10-CM | POA: Diagnosis not present

## 2023-07-19 DIAGNOSIS — K219 Gastro-esophageal reflux disease without esophagitis: Secondary | ICD-10-CM | POA: Diagnosis not present

## 2023-07-19 DIAGNOSIS — G8929 Other chronic pain: Secondary | ICD-10-CM | POA: Diagnosis not present

## 2023-07-19 DIAGNOSIS — S8992XA Unspecified injury of left lower leg, initial encounter: Secondary | ICD-10-CM | POA: Diagnosis not present

## 2023-07-19 DIAGNOSIS — Z9849 Cataract extraction status, unspecified eye: Secondary | ICD-10-CM | POA: Diagnosis not present

## 2023-07-19 DIAGNOSIS — M1712 Unilateral primary osteoarthritis, left knee: Secondary | ICD-10-CM | POA: Diagnosis not present

## 2023-07-19 DIAGNOSIS — I10 Essential (primary) hypertension: Secondary | ICD-10-CM | POA: Diagnosis not present

## 2023-07-19 DIAGNOSIS — M545 Low back pain, unspecified: Secondary | ICD-10-CM | POA: Diagnosis not present

## 2023-07-19 DIAGNOSIS — G5693 Unspecified mononeuropathy of bilateral upper limbs: Secondary | ICD-10-CM | POA: Diagnosis not present

## 2023-07-19 DIAGNOSIS — R918 Other nonspecific abnormal finding of lung field: Secondary | ICD-10-CM | POA: Diagnosis not present

## 2023-07-19 DIAGNOSIS — M7989 Other specified soft tissue disorders: Secondary | ICD-10-CM | POA: Diagnosis not present

## 2023-07-19 DIAGNOSIS — E785 Hyperlipidemia, unspecified: Secondary | ICD-10-CM | POA: Diagnosis not present

## 2023-07-19 DIAGNOSIS — F32A Depression, unspecified: Secondary | ICD-10-CM | POA: Diagnosis not present

## 2023-07-20 DIAGNOSIS — F4323 Adjustment disorder with mixed anxiety and depressed mood: Secondary | ICD-10-CM | POA: Diagnosis not present

## 2023-07-26 DIAGNOSIS — R918 Other nonspecific abnormal finding of lung field: Secondary | ICD-10-CM | POA: Diagnosis not present

## 2023-08-02 DIAGNOSIS — D225 Melanocytic nevi of trunk: Secondary | ICD-10-CM | POA: Diagnosis not present

## 2023-08-02 DIAGNOSIS — L538 Other specified erythematous conditions: Secondary | ICD-10-CM | POA: Diagnosis not present

## 2023-08-02 DIAGNOSIS — D2272 Melanocytic nevi of left lower limb, including hip: Secondary | ICD-10-CM | POA: Diagnosis not present

## 2023-08-02 DIAGNOSIS — L820 Inflamed seborrheic keratosis: Secondary | ICD-10-CM | POA: Diagnosis not present

## 2023-08-02 DIAGNOSIS — Z7189 Other specified counseling: Secondary | ICD-10-CM | POA: Diagnosis not present

## 2023-08-02 DIAGNOSIS — L821 Other seborrheic keratosis: Secondary | ICD-10-CM | POA: Diagnosis not present

## 2023-08-02 DIAGNOSIS — Z08 Encounter for follow-up examination after completed treatment for malignant neoplasm: Secondary | ICD-10-CM | POA: Diagnosis not present

## 2023-08-02 DIAGNOSIS — L815 Leukoderma, not elsewhere classified: Secondary | ICD-10-CM | POA: Diagnosis not present

## 2023-08-02 DIAGNOSIS — Z1283 Encounter for screening for malignant neoplasm of skin: Secondary | ICD-10-CM | POA: Diagnosis not present

## 2023-08-02 DIAGNOSIS — Z85828 Personal history of other malignant neoplasm of skin: Secondary | ICD-10-CM | POA: Diagnosis not present

## 2023-08-02 DIAGNOSIS — L814 Other melanin hyperpigmentation: Secondary | ICD-10-CM | POA: Diagnosis not present

## 2023-08-02 DIAGNOSIS — L2989 Other pruritus: Secondary | ICD-10-CM | POA: Diagnosis not present

## 2023-08-04 DIAGNOSIS — R911 Solitary pulmonary nodule: Secondary | ICD-10-CM | POA: Diagnosis not present

## 2023-08-04 DIAGNOSIS — E039 Hypothyroidism, unspecified: Secondary | ICD-10-CM | POA: Diagnosis not present

## 2023-08-04 DIAGNOSIS — E785 Hyperlipidemia, unspecified: Secondary | ICD-10-CM | POA: Diagnosis not present

## 2023-08-04 DIAGNOSIS — G5693 Unspecified mononeuropathy of bilateral upper limbs: Secondary | ICD-10-CM | POA: Diagnosis not present

## 2023-08-04 DIAGNOSIS — R871 Abnormal level of hormones in specimens from female genital organs: Secondary | ICD-10-CM | POA: Diagnosis not present

## 2023-08-04 DIAGNOSIS — F4323 Adjustment disorder with mixed anxiety and depressed mood: Secondary | ICD-10-CM | POA: Diagnosis not present

## 2023-08-04 DIAGNOSIS — Z1331 Encounter for screening for depression: Secondary | ICD-10-CM | POA: Diagnosis not present

## 2023-08-04 DIAGNOSIS — M25569 Pain in unspecified knee: Secondary | ICD-10-CM | POA: Diagnosis not present

## 2023-08-04 DIAGNOSIS — E7849 Other hyperlipidemia: Secondary | ICD-10-CM | POA: Diagnosis not present

## 2023-08-04 DIAGNOSIS — F32A Depression, unspecified: Secondary | ICD-10-CM | POA: Diagnosis not present

## 2023-08-04 DIAGNOSIS — K219 Gastro-esophageal reflux disease without esophagitis: Secondary | ICD-10-CM | POA: Diagnosis not present

## 2023-08-24 DIAGNOSIS — F4323 Adjustment disorder with mixed anxiety and depressed mood: Secondary | ICD-10-CM | POA: Diagnosis not present

## 2023-09-20 DIAGNOSIS — M545 Low back pain, unspecified: Secondary | ICD-10-CM | POA: Diagnosis not present

## 2023-09-27 DIAGNOSIS — M545 Low back pain, unspecified: Secondary | ICD-10-CM | POA: Diagnosis not present

## 2023-10-03 DIAGNOSIS — L309 Dermatitis, unspecified: Secondary | ICD-10-CM | POA: Diagnosis not present

## 2023-10-04 DIAGNOSIS — M545 Low back pain, unspecified: Secondary | ICD-10-CM | POA: Diagnosis not present

## 2023-10-06 DIAGNOSIS — F32A Depression, unspecified: Secondary | ICD-10-CM | POA: Diagnosis not present

## 2023-10-06 DIAGNOSIS — R871 Abnormal level of hormones in specimens from female genital organs: Secondary | ICD-10-CM | POA: Diagnosis not present

## 2023-10-06 DIAGNOSIS — E7849 Other hyperlipidemia: Secondary | ICD-10-CM | POA: Diagnosis not present

## 2023-10-06 DIAGNOSIS — E039 Hypothyroidism, unspecified: Secondary | ICD-10-CM | POA: Diagnosis not present

## 2023-10-06 DIAGNOSIS — M25569 Pain in unspecified knee: Secondary | ICD-10-CM | POA: Diagnosis not present

## 2023-10-06 DIAGNOSIS — E785 Hyperlipidemia, unspecified: Secondary | ICD-10-CM | POA: Diagnosis not present

## 2023-10-06 DIAGNOSIS — K219 Gastro-esophageal reflux disease without esophagitis: Secondary | ICD-10-CM | POA: Diagnosis not present

## 2023-10-06 DIAGNOSIS — G5693 Unspecified mononeuropathy of bilateral upper limbs: Secondary | ICD-10-CM | POA: Diagnosis not present

## 2023-10-06 DIAGNOSIS — R911 Solitary pulmonary nodule: Secondary | ICD-10-CM | POA: Diagnosis not present

## 2023-10-09 DIAGNOSIS — R011 Cardiac murmur, unspecified: Secondary | ICD-10-CM | POA: Diagnosis not present

## 2023-10-31 DIAGNOSIS — R911 Solitary pulmonary nodule: Secondary | ICD-10-CM | POA: Diagnosis not present

## 2023-10-31 DIAGNOSIS — F32A Depression, unspecified: Secondary | ICD-10-CM | POA: Diagnosis not present

## 2023-10-31 DIAGNOSIS — E039 Hypothyroidism, unspecified: Secondary | ICD-10-CM | POA: Diagnosis not present

## 2023-10-31 DIAGNOSIS — S22080A Wedge compression fracture of T11-T12 vertebra, initial encounter for closed fracture: Secondary | ICD-10-CM | POA: Diagnosis not present

## 2023-10-31 DIAGNOSIS — Z1331 Encounter for screening for depression: Secondary | ICD-10-CM | POA: Diagnosis not present

## 2023-10-31 DIAGNOSIS — M25569 Pain in unspecified knee: Secondary | ICD-10-CM | POA: Diagnosis not present

## 2023-10-31 DIAGNOSIS — G5693 Unspecified mononeuropathy of bilateral upper limbs: Secondary | ICD-10-CM | POA: Diagnosis not present

## 2023-10-31 DIAGNOSIS — E7849 Other hyperlipidemia: Secondary | ICD-10-CM | POA: Diagnosis not present

## 2023-10-31 DIAGNOSIS — K219 Gastro-esophageal reflux disease without esophagitis: Secondary | ICD-10-CM | POA: Diagnosis not present

## 2023-10-31 DIAGNOSIS — R871 Abnormal level of hormones in specimens from female genital organs: Secondary | ICD-10-CM | POA: Diagnosis not present

## 2023-10-31 DIAGNOSIS — R300 Dysuria: Secondary | ICD-10-CM | POA: Diagnosis not present

## 2023-10-31 DIAGNOSIS — R202 Paresthesia of skin: Secondary | ICD-10-CM | POA: Diagnosis not present

## 2023-10-31 DIAGNOSIS — E785 Hyperlipidemia, unspecified: Secondary | ICD-10-CM | POA: Diagnosis not present

## 2023-11-01 DIAGNOSIS — J3089 Other allergic rhinitis: Secondary | ICD-10-CM | POA: Diagnosis not present

## 2023-11-01 DIAGNOSIS — R053 Chronic cough: Secondary | ICD-10-CM | POA: Diagnosis not present

## 2023-11-02 DIAGNOSIS — M545 Low back pain, unspecified: Secondary | ICD-10-CM | POA: Diagnosis not present

## 2023-11-09 DIAGNOSIS — M545 Low back pain, unspecified: Secondary | ICD-10-CM | POA: Diagnosis not present

## 2023-11-10 DIAGNOSIS — J342 Deviated nasal septum: Secondary | ICD-10-CM | POA: Diagnosis not present

## 2023-11-10 DIAGNOSIS — J320 Chronic maxillary sinusitis: Secondary | ICD-10-CM | POA: Diagnosis not present

## 2023-11-10 DIAGNOSIS — J3489 Other specified disorders of nose and nasal sinuses: Secondary | ICD-10-CM | POA: Diagnosis not present

## 2023-11-14 DIAGNOSIS — Z961 Presence of intraocular lens: Secondary | ICD-10-CM | POA: Diagnosis not present

## 2023-11-14 DIAGNOSIS — H5213 Myopia, bilateral: Secondary | ICD-10-CM | POA: Diagnosis not present

## 2023-11-14 DIAGNOSIS — H524 Presbyopia: Secondary | ICD-10-CM | POA: Diagnosis not present

## 2023-11-17 DIAGNOSIS — E785 Hyperlipidemia, unspecified: Secondary | ICD-10-CM | POA: Diagnosis not present

## 2023-11-17 DIAGNOSIS — R300 Dysuria: Secondary | ICD-10-CM | POA: Diagnosis not present

## 2023-11-17 DIAGNOSIS — K219 Gastro-esophageal reflux disease without esophagitis: Secondary | ICD-10-CM | POA: Diagnosis not present

## 2023-11-17 DIAGNOSIS — J329 Chronic sinusitis, unspecified: Secondary | ICD-10-CM | POA: Diagnosis not present

## 2023-11-17 DIAGNOSIS — Z1211 Encounter for screening for malignant neoplasm of colon: Secondary | ICD-10-CM | POA: Diagnosis not present

## 2023-11-17 DIAGNOSIS — I499 Cardiac arrhythmia, unspecified: Secondary | ICD-10-CM | POA: Diagnosis not present

## 2023-11-17 DIAGNOSIS — Z1231 Encounter for screening mammogram for malignant neoplasm of breast: Secondary | ICD-10-CM | POA: Diagnosis not present

## 2023-11-23 DIAGNOSIS — M545 Low back pain, unspecified: Secondary | ICD-10-CM | POA: Diagnosis not present

## 2023-11-29 DIAGNOSIS — R053 Chronic cough: Secondary | ICD-10-CM | POA: Diagnosis not present

## 2023-11-29 DIAGNOSIS — E669 Obesity, unspecified: Secondary | ICD-10-CM | POA: Diagnosis not present

## 2023-11-29 DIAGNOSIS — Z860101 Personal history of adenomatous and serrated colon polyps: Secondary | ICD-10-CM | POA: Diagnosis not present

## 2023-11-29 DIAGNOSIS — R12 Heartburn: Secondary | ICD-10-CM | POA: Diagnosis not present

## 2023-11-29 DIAGNOSIS — K5900 Constipation, unspecified: Secondary | ICD-10-CM | POA: Diagnosis not present

## 2023-11-29 DIAGNOSIS — Z09 Encounter for follow-up examination after completed treatment for conditions other than malignant neoplasm: Secondary | ICD-10-CM | POA: Diagnosis not present

## 2023-12-04 DIAGNOSIS — M47819 Spondylosis without myelopathy or radiculopathy, site unspecified: Secondary | ICD-10-CM | POA: Diagnosis not present

## 2023-12-04 DIAGNOSIS — Z96611 Presence of right artificial shoulder joint: Secondary | ICD-10-CM | POA: Diagnosis not present

## 2023-12-04 DIAGNOSIS — R911 Solitary pulmonary nodule: Secondary | ICD-10-CM | POA: Diagnosis not present

## 2023-12-06 DIAGNOSIS — R053 Chronic cough: Secondary | ICD-10-CM | POA: Diagnosis not present

## 2023-12-07 DIAGNOSIS — Z471 Aftercare following joint replacement surgery: Secondary | ICD-10-CM | POA: Diagnosis not present

## 2023-12-07 DIAGNOSIS — Z96611 Presence of right artificial shoulder joint: Secondary | ICD-10-CM | POA: Diagnosis not present

## 2023-12-20 DIAGNOSIS — M545 Low back pain, unspecified: Secondary | ICD-10-CM | POA: Diagnosis not present

## 2024-01-03 DIAGNOSIS — R053 Chronic cough: Secondary | ICD-10-CM | POA: Diagnosis not present

## 2024-01-03 DIAGNOSIS — K219 Gastro-esophageal reflux disease without esophagitis: Secondary | ICD-10-CM | POA: Diagnosis not present

## 2024-01-18 DIAGNOSIS — Z860101 Personal history of adenomatous and serrated colon polyps: Secondary | ICD-10-CM | POA: Diagnosis not present

## 2024-01-18 DIAGNOSIS — K635 Polyp of colon: Secondary | ICD-10-CM | POA: Diagnosis not present

## 2024-01-18 DIAGNOSIS — K641 Second degree hemorrhoids: Secondary | ICD-10-CM | POA: Diagnosis not present

## 2024-01-18 DIAGNOSIS — D122 Benign neoplasm of ascending colon: Secondary | ICD-10-CM | POA: Diagnosis not present

## 2024-01-18 DIAGNOSIS — D120 Benign neoplasm of cecum: Secondary | ICD-10-CM | POA: Diagnosis not present

## 2024-01-30 DIAGNOSIS — M7061 Trochanteric bursitis, right hip: Secondary | ICD-10-CM | POA: Diagnosis not present

## 2024-01-30 DIAGNOSIS — M79671 Pain in right foot: Secondary | ICD-10-CM | POA: Diagnosis not present

## 2024-01-30 DIAGNOSIS — G43001 Migraine without aura, not intractable, with status migrainosus: Secondary | ICD-10-CM | POA: Diagnosis not present

## 2024-01-30 DIAGNOSIS — M79672 Pain in left foot: Secondary | ICD-10-CM | POA: Diagnosis not present

## 2024-01-31 DIAGNOSIS — E669 Obesity, unspecified: Secondary | ICD-10-CM | POA: Diagnosis not present

## 2024-01-31 DIAGNOSIS — R12 Heartburn: Secondary | ICD-10-CM | POA: Diagnosis not present

## 2024-02-06 DIAGNOSIS — M722 Plantar fascial fibromatosis: Secondary | ICD-10-CM | POA: Diagnosis not present

## 2024-02-06 DIAGNOSIS — M79671 Pain in right foot: Secondary | ICD-10-CM | POA: Diagnosis not present

## 2024-02-20 DIAGNOSIS — Z1231 Encounter for screening mammogram for malignant neoplasm of breast: Secondary | ICD-10-CM | POA: Diagnosis not present

## 2024-02-20 DIAGNOSIS — M7062 Trochanteric bursitis, left hip: Secondary | ICD-10-CM | POA: Diagnosis not present

## 2024-02-27 DIAGNOSIS — H5211 Myopia, right eye: Secondary | ICD-10-CM | POA: Diagnosis not present

## 2024-02-27 DIAGNOSIS — M722 Plantar fascial fibromatosis: Secondary | ICD-10-CM | POA: Diagnosis not present

## 2024-03-26 DIAGNOSIS — K2970 Gastritis, unspecified, without bleeding: Secondary | ICD-10-CM | POA: Diagnosis not present

## 2024-03-26 DIAGNOSIS — K31819 Angiodysplasia of stomach and duodenum without bleeding: Secondary | ICD-10-CM | POA: Diagnosis not present

## 2024-03-26 DIAGNOSIS — D130 Benign neoplasm of esophagus: Secondary | ICD-10-CM | POA: Diagnosis not present

## 2024-03-26 DIAGNOSIS — R053 Chronic cough: Secondary | ICD-10-CM | POA: Diagnosis not present

## 2024-03-26 DIAGNOSIS — K319 Disease of stomach and duodenum, unspecified: Secondary | ICD-10-CM | POA: Diagnosis not present

## 2024-03-26 DIAGNOSIS — K219 Gastro-esophageal reflux disease without esophagitis: Secondary | ICD-10-CM | POA: Diagnosis not present

## 2024-03-26 DIAGNOSIS — K317 Polyp of stomach and duodenum: Secondary | ICD-10-CM | POA: Diagnosis not present

## 2024-03-29 DIAGNOSIS — M7062 Trochanteric bursitis, left hip: Secondary | ICD-10-CM | POA: Diagnosis not present

## 2024-03-29 DIAGNOSIS — M5432 Sciatica, left side: Secondary | ICD-10-CM | POA: Diagnosis not present

## 2024-05-02 DIAGNOSIS — Z23 Encounter for immunization: Secondary | ICD-10-CM | POA: Diagnosis not present

## 2024-05-02 DIAGNOSIS — E559 Vitamin D deficiency, unspecified: Secondary | ICD-10-CM | POA: Diagnosis not present

## 2024-05-02 DIAGNOSIS — R739 Hyperglycemia, unspecified: Secondary | ICD-10-CM | POA: Diagnosis not present

## 2024-05-02 DIAGNOSIS — G43001 Migraine without aura, not intractable, with status migrainosus: Secondary | ICD-10-CM | POA: Diagnosis not present

## 2024-05-02 DIAGNOSIS — E569 Vitamin deficiency, unspecified: Secondary | ICD-10-CM | POA: Diagnosis not present

## 2024-05-02 DIAGNOSIS — F32A Depression, unspecified: Secondary | ICD-10-CM | POA: Diagnosis not present

## 2024-05-02 DIAGNOSIS — E611 Iron deficiency: Secondary | ICD-10-CM | POA: Diagnosis not present

## 2024-05-02 DIAGNOSIS — D510 Vitamin B12 deficiency anemia due to intrinsic factor deficiency: Secondary | ICD-10-CM | POA: Diagnosis not present

## 2024-05-02 DIAGNOSIS — D500 Iron deficiency anemia secondary to blood loss (chronic): Secondary | ICD-10-CM | POA: Diagnosis not present

## 2024-05-02 DIAGNOSIS — R871 Abnormal level of hormones in specimens from female genital organs: Secondary | ICD-10-CM | POA: Diagnosis not present

## 2024-05-02 DIAGNOSIS — E785 Hyperlipidemia, unspecified: Secondary | ICD-10-CM | POA: Diagnosis not present

## 2024-05-02 DIAGNOSIS — B009 Herpesviral infection, unspecified: Secondary | ICD-10-CM | POA: Diagnosis not present

## 2024-05-02 DIAGNOSIS — E039 Hypothyroidism, unspecified: Secondary | ICD-10-CM | POA: Diagnosis not present

## 2024-05-02 DIAGNOSIS — D509 Iron deficiency anemia, unspecified: Secondary | ICD-10-CM | POA: Diagnosis not present

## 2024-05-07 DIAGNOSIS — R053 Chronic cough: Secondary | ICD-10-CM | POA: Diagnosis not present

## 2024-05-07 DIAGNOSIS — J029 Acute pharyngitis, unspecified: Secondary | ICD-10-CM | POA: Diagnosis not present

## 2024-05-07 DIAGNOSIS — M722 Plantar fascial fibromatosis: Secondary | ICD-10-CM | POA: Diagnosis not present

## 2024-05-07 DIAGNOSIS — E663 Overweight: Secondary | ICD-10-CM | POA: Diagnosis not present

## 2024-05-17 DIAGNOSIS — H5211 Myopia, right eye: Secondary | ICD-10-CM | POA: Diagnosis not present

## 2024-05-20 DIAGNOSIS — M545 Low back pain, unspecified: Secondary | ICD-10-CM | POA: Diagnosis not present

## 2024-05-27 DIAGNOSIS — M19042 Primary osteoarthritis, left hand: Secondary | ICD-10-CM | POA: Diagnosis not present

## 2024-05-27 DIAGNOSIS — M19041 Primary osteoarthritis, right hand: Secondary | ICD-10-CM | POA: Diagnosis not present

## 2024-05-27 DIAGNOSIS — M19049 Primary osteoarthritis, unspecified hand: Secondary | ICD-10-CM | POA: Diagnosis not present

## 2024-05-27 DIAGNOSIS — M064 Inflammatory polyarthropathy: Secondary | ICD-10-CM | POA: Diagnosis not present

## 2024-05-27 DIAGNOSIS — R059 Cough, unspecified: Secondary | ICD-10-CM | POA: Diagnosis not present
